# Patient Record
Sex: Male | Born: 2000 | Race: White | Hispanic: No | Marital: Single | Smoking: Current every day smoker
Health system: Southern US, Community
[De-identification: ages and names within clinical notes are randomized; demographics above are authoritative.]

## PROBLEM LIST (undated history)

## (undated) DIAGNOSIS — F329 Major depressive disorder, single episode, unspecified: Secondary | ICD-10-CM

## (undated) DIAGNOSIS — F32A Depression, unspecified: Secondary | ICD-10-CM

---
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
## 2018-05-06 ENCOUNTER — Encounter (HOSPITAL_COMMUNITY): Payer: Self-pay | Admitting: Emergency Medicine

## 2018-05-06 ENCOUNTER — Other Ambulatory Visit: Payer: Self-pay

## 2018-05-06 ENCOUNTER — Emergency Department (HOSPITAL_COMMUNITY)
Admission: EM | Admit: 2018-05-06 | Discharge: 2018-05-07 | Disposition: A | Discharge status: Other Acute Inpatient Hospital | Payer: BLUE CROSS/BLUE SHIELD | Attending: Emergency Medicine | Admitting: Emergency Medicine

## 2018-05-06 DIAGNOSIS — F332 Major depressive disorder, recurrent severe without psychotic features: Secondary | ICD-10-CM | POA: Insufficient documentation

## 2018-05-06 DIAGNOSIS — F329 Major depressive disorder, single episode, unspecified: Secondary | ICD-10-CM | POA: Diagnosis present

## 2018-05-06 DIAGNOSIS — R45851 Suicidal ideations: Secondary | ICD-10-CM | POA: Diagnosis not present

## 2018-05-06 HISTORY — DX: Depression, unspecified: F32.A

## 2018-05-06 HISTORY — DX: Major depressive disorder, single episode, unspecified: F32.9

## 2018-05-06 LAB — CBC
HCT: 45.7 % (ref 39.0–52.0)
HEMOGLOBIN: 16.1 g/dL (ref 13.0–17.0)
MCH: 30.4 pg (ref 26.0–34.0)
MCHC: 35.2 g/dL (ref 30.0–36.0)
MCV: 86.4 fL (ref 78.0–100.0)
PLATELETS: 243 10*3/uL (ref 150–400)
RBC: 5.29 MIL/uL (ref 4.22–5.81)
RDW: 12.4 % (ref 11.5–15.5)
WBC: 11.4 10*3/uL — ABNORMAL HIGH (ref 4.0–10.5)

## 2018-05-06 LAB — COMPREHENSIVE METABOLIC PANEL
ALBUMIN: 4.5 g/dL (ref 3.5–5.0)
ALK PHOS: 59 U/L (ref 38–126)
ALT: 51 U/L (ref 17–63)
AST: 31 U/L (ref 15–41)
Anion gap: 8 (ref 5–15)
BILIRUBIN TOTAL: 1.1 mg/dL (ref 0.3–1.2)
BUN: 22 mg/dL — AB (ref 6–20)
CO2: 27 mmol/L (ref 22–32)
CREATININE: 1.16 mg/dL (ref 0.61–1.24)
Calcium: 9.5 mg/dL (ref 8.9–10.3)
Chloride: 108 mmol/L (ref 101–111)
GFR calc Af Amer: >60 mL/min (ref >60)
GFR calc non Af Amer: >60 mL/min (ref >60)
GLUCOSE: 102 mg/dL — AB (ref 65–99)
POTASSIUM: 3.9 mmol/L (ref 3.5–5.1)
Sodium: 143 mmol/L (ref 135–145)
TOTAL PROTEIN: 7.8 g/dL (ref 6.5–8.1)

## 2018-05-06 LAB — ACETAMINOPHEN LEVEL

## 2018-05-06 LAB — SALICYLATE LEVEL: Salicylate Lvl: <7 mg/dL (ref 2.8–30.0)

## 2018-05-06 LAB — ETHANOL: Alcohol, Ethyl (B): <10 mg/dL (ref <10)

## 2018-05-06 NOTE — ED Triage Notes (Signed)
Pt BIB police with SI without plan; hx of SI attempt. Recent worsening depression.

## 2018-05-06 NOTE — BH Assessment (Addendum)
Assessment Note  GERRALD BASU is an 17 y.o. male who presents to the ED voluntarily due to worsening depression and SI without a plan. Pt states he has been depressed for the past several weeks due to relationship issues, financial problems, and having SI. Pt states he attempted to speak with his parents about how he has been feeling and they got into an argument. Pt states his parents told him if he does not come into the ED they are going to IVC him so he agreed to come VOL. Pt states he told his parents he was thinking about killing himself but denies that he has a plan. Pt has attempted suicide in the past by hanging himself when he was 17 years old. Pt appears anxious during the assessment and continues rubbing his hands on his legs and thighs. Pt states he does not want to be in the ED because he does not have a plan to commit suicide but states he is willing to stay VOL if it is recommended.   Per Nira Conn, NP pt is recommended for continued observation for safety and stabilization due to hx of suicide attempts and current SI without a plan. EDP Melene Plan, DO and pt's nurse Leslie Andrea, RN have been advised.  Diagnosis: MDD, recurrent, severe w/o psychosis; GAD, severe   Past Medical History:  Past Medical History:  Diagnosis Date  . Depression     History reviewed. No pertinent surgical history.  Family History: No family history on file.  Social History:  has no tobacco, alcohol, and drug history on file.  Additional Social History:  Alcohol / Drug Use Pain Medications: See MAR Prescriptions: See MAR Over the Counter: See MAR History of alcohol / drug use?: No history of alcohol / drug abuse  CIWA: CIWA-Ar BP: (!) 136/54 Pulse Rate: (!) 119 COWS:    Allergies: No Known Allergies  Home Medications:  (Not in a hospital admission)  OB/GYN Status:  No LMP for male patient.  General Assessment Data Location of Assessment: [facility] TTS Assessment: In system Is this a Tele  or Face-to-Face Assessment?: Face-to-Face Is this an Initial Assessment or a Re-assessment for this encounter?: Initial Assessment Marital status: Single Is patient pregnant?: No Pregnancy Status: No Living Arrangements: Parent Can pt return to current living arrangement?: Yes Admission Status: Voluntary Is patient capable of signing voluntary admission?: Yes Referral Source: Self/Family/Friend Insurance type: BCBS     Crisis Care Plan Living Arrangements: Parent Name of Psychiatrist: none Name of Therapist: none  Education Status Is patient currently in school?: No Is the patient employed, unemployed or receiving disability?: Employed  Risk to self with the past 6 months Suicidal Ideation: Yes-Currently Present Has patient been a risk to self within the past 6 months prior to admission? : No Suicidal Intent: No Has patient had any suicidal intent within the past 6 months prior to admission? : No Is patient at risk for suicide?: Yes Suicidal Plan?: No Has patient had any suicidal plan within the past 6 months prior to admission? : No Access to Means: No What has been your use of drugs/alcohol within the last 12 months?: denies use, labs unavailable at present  Previous Attempts/Gestures: Yes How many times?: 1 Triggers for Past Attempts: Family contact, Spouse contact, Other personal contacts Intentional Self Injurious Behavior: Cutting Comment - Self Injurious Behavior: pt has hx of cutting  Family Suicide History: No Recent stressful life event(s): Conflict (Comment), Other (Comment), Financial Problems(relationship issues) Persecutory voices/beliefs?:  No Depression: Yes Depression Symptoms: Despondent, Insomnia, Isolating, Loss of interest in usual pleasures, Feeling worthless/self pity, Feeling angry/irritable, Fatigue Substance abuse history and/or treatment for substance abuse?: No Suicide prevention information given to non-admitted patients: Not applicable  Risk  to Others within the past 6 months Homicidal Ideation: No Does patient have any lifetime risk of violence toward others beyond the six months prior to admission? : No Thoughts of Harm to Others: No Current Homicidal Intent: No Current Homicidal Plan: No Access to Homicidal Means: No History of harm to others?: No Assessment of Violence: None Noted Does patient have access to weapons?: No Criminal Charges Pending?: No Does patient have a court date: No Is patient on probation?: No  Psychosis Hallucinations: None noted Delusions: None noted  Mental Status Report Appearance/Hygiene: In scrubs, Unremarkable Eye Contact: Good Motor Activity: Freedom of movement Speech: Logical/coherent Level of Consciousness: Alert Mood: Depressed, Anxious, Sad, Worthless, low self-esteem, Despair Affect: Anxious, Depressed, Flat, Sad Anxiety Level: Moderate Thought Processes: Relevant, Coherent Judgement: Impaired Orientation: Person, Time, Place, Situation, Appropriate for developmental age Obsessive Compulsive Thoughts/Behaviors: None  Cognitive Functioning Concentration: Normal Memory: Remote Intact, Recent Intact Is patient IDD: No Is patient DD?: No Insight: Fair Impulse Control: Fair Appetite: Good Have you had any weight changes? : No Change Sleep: Decreased Total Hours of Sleep: 3 Vegetative Symptoms: None  ADLScreening [facility] Assessment [facility]) Patient's cognitive ability adequate to safely complete daily activities?: Yes Patient able to express need for assistance with ADLs?: Yes Independently performs ADLs?: Yes (appropriate for developmental age)  Prior Inpatient Therapy Prior Inpatient Therapy: No  Prior Outpatient Therapy Prior Outpatient Therapy: Yes Prior Therapy Dates: 2014 Prior Therapy Facilty/Provider(s): pt does not recall Reason for Treatment: depression, suicide attempt  Does patient have an ACCT team?: No Does patient have Intensive In-House Services?  :  No Does patient have [facility] services? : No Does patient have P4CC services?: No  ADL Screening (condition at time of admission) Patient's cognitive ability adequate to safely complete daily activities?: Yes Is the patient deaf or have difficulty hearing?: No Does the patient have difficulty seeing, even when wearing glasses/contacts?: No Does the patient have difficulty concentrating, remembering, or making decisions?: No Patient able to express need for assistance with ADLs?: Yes Does the patient have difficulty dressing or bathing?: No Independently performs ADLs?: Yes (appropriate for developmental age) Does the patient have difficulty walking or climbing stairs?: No Weakness of Legs: None Weakness of Arms/Hands: None  Home Assistive Devices/Equipment Home Assistive Devices/Equipment: None    Abuse/Neglect Assessment (Assessment to be complete while patient is alone) Abuse/Neglect Assessment Can Be Completed: Yes Physical Abuse: Denies Verbal Abuse: Denies Sexual Abuse: Denies Exploitation of patient/patient's resources: Denies Self-Neglect: Denies     Merchant navy officerAdvance Directives (For Healthcare) Does Patient Have a Medical Advance Directive?: No Would patient like information on creating a medical advance directive?: No - Patient declined    Additional Information 1:1 In Past 12 Months?: No CIRT Risk: No Elopement Risk: No Does patient have medical clearance?: Yes     Disposition: Per Nira ConnJason Berry, NP pt is recommended for continued observation for safety and stabilization due to hx of suicide attempts and current SI without a plan. EDP Melene PlanFloyd, Dan, DO and pt's nurse Leslie Andreaashell, RN have been advised. Disposition Initial Assessment Completed for this Encounter: Yes Disposition of Patient: (overnight OBS pending AM psych assessment ) Patient refused recommended treatment: No  On Site Evaluation by:   Reviewed with Physician:    Servando SnareAquicha R  Cristela Felt 05/06/2018 9:14 PM

## 2018-05-06 NOTE — ED Provider Notes (Signed)
[facility]-EMERGENCY DEPT Provider Note   CSN: [id number] Arrival date & time: 05/06/18  1825     History   Chief Complaint Chief Complaint  Patient presents with  . Suicidal  . Depression    HPI Lee Warren is a 17 y.o. male.  [age] y oM with a chief complaint of suicidal ideation.  The patient has tried to commit suicide before.  He tried to hang himself as well as tried to cut himself.  This was in the remote past though.  He is not sure that anything has significantly changed in his life but the stress of people trying to tell him what to do has made him feel like he needs to kill himself.  No current plan.  Has a history of bipolar disorder and is not taking his medication for a long time because of the way it made him feel.  He denies homicidal ideation denies hallucinations.  The history is provided by the patient.  Illness  This is a recurrent problem. The current episode started 2 days ago. The problem occurs constantly. The problem has been gradually worsening. Pertinent negatives include no chest pain, no abdominal pain, no headaches and no shortness of breath. Nothing aggravates the symptoms. Nothing relieves the symptoms. He has tried nothing for the symptoms. The treatment provided no relief.    Past Medical History:  Diagnosis Date  . Depression     There are no active problems to display for this patient.   History reviewed. No pertinent surgical history.      Home Medications    Prior to Admission medications   Not on File    Family History No family history on file.  Social History Social History   Tobacco Use  . Smoking status: Not on file  Substance Use Topics  . Alcohol use: Not on file  . Drug use: Not on file     Allergies   Patient has no known allergies.   Review of Systems Review of Systems  Constitutional: Negative for chills and fever.  HENT: Negative for congestion and facial swelling.   Eyes:  Negative for discharge and visual disturbance.  Respiratory: Negative for shortness of breath.   Cardiovascular: Negative for chest pain and palpitations.  Gastrointestinal: Negative for abdominal pain, diarrhea and vomiting.  Musculoskeletal: Negative for arthralgias and myalgias.  Skin: Negative for color change and rash.  Neurological: Negative for tremors, syncope and headaches.  Psychiatric/Behavioral: Positive for suicidal ideas. Negative for confusion and dysphoric mood.     Physical Exam Updated Vital Signs BP (!) 136/54 (BP Location: Left Arm)   Pulse (!) 119   Temp 99.4 F (37.4 C) (Oral)   Resp (!) 24   SpO2 98%   Physical Exam  Constitutional: He is oriented to person, place, and time. He appears well-developed and well-nourished.  Warm to touch  HENT:  Head: Normocephalic and atraumatic.  Eyes: Pupils are equal, round, and reactive to light. EOM are normal.  Neck: Normal range of motion. Neck supple. No JVD present.  Cardiovascular: Normal rate and regular rhythm. Exam reveals no gallop and no friction rub.  No murmur heard. Pulmonary/Chest: No respiratory distress. He has no wheezes.  Abdominal: He exhibits no distension. There is no tenderness. There is no rebound and no guarding.  Musculoskeletal: Normal range of motion.  Neurological: He is alert and oriented to person, place, and time.  Skin: No rash noted. He is diaphoretic. No pallor.  Psychiatric:  He has a normal mood and affect. His behavior is normal.  Nursing note and vitals reviewed.    ED Treatments / Results  Labs (all labs ordered are listed, but only abnormal results are displayed) Labs Reviewed  COMPREHENSIVE METABOLIC PANEL - Abnormal; Notable for the following components:      Result Value   Glucose, Bld 102 (*)    BUN 22 (*)    All other components within normal limits  ACETAMINOPHEN LEVEL - Abnormal; Notable for the following components:   Acetaminophen (Tylenol), Serum <10 (*)     All other components within normal limits  CBC - Abnormal; Notable for the following components:   WBC 11.4 (*)    All other components within normal limits  ETHANOL  SALICYLATE LEVEL  RAPID URINE DRUG SCREEN, HOSP PERFORMED    EKG None  Radiology No results found.  Procedures Procedures (including critical care time)  Medications Ordered in ED Medications - No data to display   Initial Impression / Assessment and Plan / ED Course  I have reviewed the triage vital signs and the nursing notes.  Pertinent labs & imaging results that were available during my care of the patient were reviewed by me and considered in my medical decision making (see chart for details).     17 yo M with a past medical history of bipolar disorder comes with a chief complaint of suicidal ideation.  The patient denies any medical symptoms.  He has diaphoretic and warm to touch on arrival.  He denies fevers cough congestion abdominal pain vomiting or diarrhea.  He did have a sweatshirt on and was brought in the back of the police car which he did not feel was adequately air-conditioned.  This is most likely the cause of his mild elevation of his temperature.  We will give him something to eat and drink.  Feel he is medically clear for evaluation.  Psych will reeval in am.   The patients results and plan were reviewed and discussed.   Any x-rays performed were independently reviewed by myself.   Differential diagnosis were considered with the presenting HPI.  Medications - No data to display  Vitals:   05/06/18 1848  BP: (!) 136/54  Pulse: (!) 119  Resp: (!) 24  Temp: 99.4 F (37.4 C)  TempSrc: Oral  SpO2: 98%    Final diagnoses:  Suicidal thoughts     Final Clinical Impressions(s) / ED Diagnoses   Final diagnoses:  Suicidal thoughts    ED Discharge Orders    None       Melene Plan, DO 05/06/18 2253

## 2018-05-06 NOTE — ED Notes (Signed)
Bed: [id number] Expected date:  Expected time:  Means of arrival:  Comments: Hold RM 30

## 2018-05-06 NOTE — Progress Notes (Signed)
Per Lee ConnJason Berry, NP pt is recommended for continued observation for safety and stabilization due to hx of suicide attempts and current SI without a Warren. EDP Lee PlanFloyd, Dan, DO and pt's nurse Lee Andreaashell, RN have been advised.  Lee BruinsAquicha Jaelen Warren, MSW, LCSW Therapeutic Triage Specialist  [phone]

## 2018-05-07 ENCOUNTER — Encounter (HOSPITAL_COMMUNITY): Payer: Self-pay

## 2018-05-07 ENCOUNTER — Inpatient Hospital Stay (HOSPITAL_COMMUNITY)
Admission: AD | Admit: 2018-05-07 | Discharge: 2018-05-11 | DRG: 885 | Disposition: A | Discharge status: Home / Self Care | Payer: BLUE CROSS/BLUE SHIELD | Source: Intra-hospital | Attending: Psychiatry | Admitting: Psychiatry

## 2018-05-07 ENCOUNTER — Other Ambulatory Visit: Payer: Self-pay

## 2018-05-07 DIAGNOSIS — F17210 Nicotine dependence, cigarettes, uncomplicated: Secondary | ICD-10-CM | POA: Diagnosis present

## 2018-05-07 DIAGNOSIS — Z915 Personal history of self-harm: Secondary | ICD-10-CM

## 2018-05-07 DIAGNOSIS — R454 Irritability and anger: Secondary | ICD-10-CM | POA: Diagnosis not present

## 2018-05-07 DIAGNOSIS — F332 Major depressive disorder, recurrent severe without psychotic features: Secondary | ICD-10-CM | POA: Diagnosis present

## 2018-05-07 DIAGNOSIS — F419 Anxiety disorder, unspecified: Secondary | ICD-10-CM | POA: Diagnosis not present

## 2018-05-07 DIAGNOSIS — R45851 Suicidal ideations: Secondary | ICD-10-CM | POA: Diagnosis present

## 2018-05-07 DIAGNOSIS — Z599 Problem related to housing and economic circumstances, unspecified: Secondary | ICD-10-CM | POA: Diagnosis not present

## 2018-05-07 DIAGNOSIS — G4700 Insomnia, unspecified: Secondary | ICD-10-CM | POA: Diagnosis not present

## 2018-05-07 DIAGNOSIS — F639 Impulse disorder, unspecified: Secondary | ICD-10-CM | POA: Diagnosis present

## 2018-05-07 DIAGNOSIS — Z630 Problems in relationship with spouse or partner: Secondary | ICD-10-CM | POA: Diagnosis not present

## 2018-05-07 DIAGNOSIS — R451 Restlessness and agitation: Secondary | ICD-10-CM | POA: Diagnosis not present

## 2018-05-07 DIAGNOSIS — F6381 Intermittent explosive disorder: Secondary | ICD-10-CM

## 2018-05-07 LAB — RAPID URINE DRUG SCREEN, HOSP PERFORMED
Amphetamines: NOT DETECTED
BARBITURATES: NOT DETECTED
BENZODIAZEPINES: NOT DETECTED
Cocaine: NOT DETECTED
Opiates: NOT DETECTED
TETRAHYDROCANNABINOL: NOT DETECTED

## 2018-05-07 MED ORDER — ACETAMINOPHEN 325 MG PO TABS: 650.0000 mg | ORAL_TABLET | Freq: Four times a day (QID) | ORAL | Status: DC | PRN

## 2018-05-07 MED ORDER — ALUM & MAG HYDROXIDE-SIMETH 200-200-20 MG/5ML PO SUSP: 30.0000 mL | ORAL | Status: DC | PRN

## 2018-05-07 MED ORDER — TRAZODONE HCL 50 MG PO TABS
50.0000 mg | ORAL_TABLET | Freq: Every evening | ORAL | Status: DC | PRN
  Administered 2018-05-08 – 2018-05-10 (×3): 50 mg via ORAL
  Filled 2018-05-07 (×12): qty 1

## 2018-05-07 MED ORDER — NICOTINE 21 MG/24HR TD PT24
21.0000 mg | MEDICATED_PATCH | Freq: Every day | TRANSDERMAL | Status: DC
  Administered 2018-05-08 – 2018-05-11 (×4): 21 mg via TRANSDERMAL
  Filled 2018-05-07 (×7): qty 1

## 2018-05-07 MED ORDER — HYDROXYZINE HCL 25 MG PO TABS
25.0000 mg | ORAL_TABLET | Freq: Four times a day (QID) | ORAL | Status: DC | PRN
  Administered 2018-05-08: 25 mg via ORAL
  Filled 2018-05-07: qty 1

## 2018-05-07 MED ORDER — MAGNESIUM HYDROXIDE 400 MG/5ML PO SUSP: 30.0000 mL | Freq: Every day | ORAL | Status: DC | PRN

## 2018-05-07 NOTE — BH Assessment (Signed)
Patient evaluated by Dr. Sharma Lee Warren and Wyvonna Lee Issaic Welliver, NP. Inpatient Treatment was recommended. [facility] Bed assignment pending.

## 2018-05-07 NOTE — ED Notes (Signed)
Patient's mother called to say if patient is discharged he will not come home and is homeless without a driver's permit.  Mother reports patient is [facility] by Franchot [facility] and has been diagnosed with bipolar disorder but is not taking any medications.  Patient has become increasingly suicidal in the past few months even making up songs about suicide.

## 2018-05-07 NOTE — Progress Notes (Signed)
Skin and belongings assessment complete. Skin clear. No bruises, scratches, tattoos, or wounds. Consent papers reviewed with patient. All questions answered. Patient signed 72 hour request for discharge paper 05/07/18 at 1908.  Patient oriented to the unit. Safety maintained with 15 minute checks.

## 2018-05-07 NOTE — Progress Notes (Signed)
Pt is a 17 year old male admitted with depression and SI without a plan    He denies intent presently and said he just wants to go home   He said next time he feels like that he will talk to his parents or his girlfriend   He said his parents made him come to the ED and he doesn't feel like he should be here   Pt denies previous attempts but in his history it said he tried to hang himself when he was 12     Pt was admitted and orders received    Verbal support given   Medications offered but patient said he did not need anything to help him sleep   Pt did sign his 72 hour request for discharge   Q 15 min checks started   Pt is safe at present

## 2018-05-07 NOTE — ED Notes (Signed)
[facility]  [facility] called for transport.

## 2018-05-07 NOTE — ED Notes (Addendum)
Mother visiting with patient.  Patient upset that he is going to be admitted.  " I won't go, they can't make me."  Explained to patient and mother that if patient does not agree to go voluntarily, he would be IVC'd.  Patient replied tearful,"  I just want to go home and play my music."  I came in voluntary, why do I have to be admitted to the hospital?"  Nurse gently reminded patient that he had been having suicidal thoughts and needed to be placed on some medication that would help him with that.  Patient denied suicidal thoughts, but admits he needs medication.

## 2018-05-07 NOTE — ED Notes (Signed)
Patient lying in bed.  Mother still visiting at bedside.

## 2018-05-07 NOTE — ED Notes (Signed)
Patient has been calm and cooperative during shift. Patient has interactive appropriately with staff and peers.  Patient has asked several times if he can go home. Patient denies SI/HI?AVH at this time. Writer explained treatment plan to patient. Encouragement and support provided and safety maintain. Q 15 min safety checks remain in place and video monitoring.

## 2018-05-07 NOTE — Tx Team (Signed)
Initial Treatment Plan 05/07/2018 7:42 PM Lee Warren Tilley [id number]    PATIENT STRESSORS: Marital or family conflict Medication change or noncompliance   PATIENT STRENGTHS: Average or above average intelligence General fund of knowledge Supportive family/friends   PATIENT IDENTIFIED PROBLEMS: "I just want to leave   I dont need to be here"  "get over my depression"                   DISCHARGE CRITERIA:  Improved stabilization in mood, thinking, and/or behavior Reduction of life-threatening or endangering symptoms to within safe limits Verbal commitment to aftercare and medication compliance  PRELIMINARY DISCHARGE PLAN: Attend aftercare/continuing care group Outpatient therapy  PATIENT/FAMILY INVOLVEMENT: This treatment plan has been presented to and reviewed with the patient, Lee Warren Mcgahan, and/or family member, .  The patient and family have been given the opportunity to ask questions and make suggestions.  Andrena Mewsuttall, Gunnison Chahal J, RN 05/07/2018, 7:42 PM

## 2018-05-07 NOTE — Consult Note (Addendum)
[facility] Consult   Reason for Consult:  Thoughts of suicidal ideation Referring Physician:  ER physiciam Patient Identification: Lee Warren MRN:  [id number] Principal Diagnosis: MDD (major depressive disorder), recurrent severe, without psychosis ([place]) Diagnosis:   Patient Active Problem List   Diagnosis Date Noted  . MDD (major depressive disorder), recurrent severe, without psychosis ([place]) [F33.2] 05/07/2018    Total Time spent with patient: 30 minutes  Subjective:   Lee Warren is a 17 y.o. male patient admitted with thoughts of suicide for the past month in the setting of relationship stressors and worries about his future. Previously seen psychiatrist but cannot remember the name.  Tried Wellbutrin and Vyvanse before.  Patient does reference some mania behaviors a few months ago, beginning in January to March when he felt really good although this appears most likely a period when he was no longer depressed and feeling better. He reports no problems with appetite but poor sleep.   Past Psychiatric History: Major depression and prior history of self-injurious behaviors by burning at 17 y/o.   Risk to Self: Suicidal Ideation: Yes-Currently Present Suicidal Intent: No Is patient at risk for suicide?: Yes Suicidal Plan?: No Access to Means: No What has been your use of drugs/alcohol within the last 12 months?: denies use, labs unavailable at present  How many times?: 1 Triggers for Past Attempts: Family contact, Spouse contact, Other personal contacts Intentional Self Injurious Behavior: Cutting Comment - Self Injurious Behavior: pt has hx of cutting  Risk to Others: Homicidal Ideation: No Thoughts of Harm to Others: No Current Homicidal Intent: No Current Homicidal Plan: No Access to Homicidal Means: No History of harm to others?: No Assessment of Violence: None Noted Does patient have access to weapons?: No Criminal Charges Pending?: No Does patient  have a court date: No Prior Inpatient Therapy: Prior Inpatient Therapy: No Prior Outpatient Therapy: Prior Outpatient Therapy: Yes Prior Therapy Dates: Feb 2019 Prior Therapy Facilty/Provider(s): pt does not recall Reason for Treatment: depression, suicide attempt  Does patient have an ACCT team?: No Does patient have Intensive In-House Services?  : No Does patient have [facility] services? : No Does patient have P4CC services?: No  Past Medical History:  Past Medical History:  Diagnosis Date  . Depression    History reviewed. No pertinent surgical history. Family History: No family history on file. Family Psychiatric  History: Unknown  Social History:  Social History   Substance and Sexual Activity  Alcohol Use Not on file     Social History   Substance and Sexual Activity  Drug Use Not on file    Social History   Socioeconomic History  . Marital status: Single    Spouse name: Not on file  . Number of children: Not on file  . Years of education: Not on file  . Highest education level: Not on file  Occupational History  . Not on file  Social Needs  . Financial resource strain: Not on file  . Food insecurity:    Worry: Not on file    Inability: Not on file  . Transportation needs:    Medical: Not on file    Non-medical: Not on file  Tobacco Use  . Smoking status: Not on file  Substance and Sexual Activity  . Alcohol use: Not on file  . Drug use: Not on file  . Sexual activity: Not on file  Lifestyle  . Physical activity:    Days per week: Not on file  Minutes per session: Not on file  . Stress: Not on file  Relationships  . Social connections:    Talks on phone: Not on file    Gets together: Not on file    Attends religious service: Not on file    Active member of club or organization: Not on file    Attends meetings of clubs or organizations: Not on file    Relationship status: Not on file  Other Topics Concern  . Not on file  Social History  Narrative  . Not on file   Additional Social History:    Allergies:  No Known Allergies  Labs:  Results for orders placed or performed during the hospital encounter of 05/06/18 (from the past 48 hour(s))  Comprehensive metabolic panel     Status: Abnormal   Collection Time: 05/06/18  9:05 PM  Result Value Ref Range   Sodium 143 135 - 145 mmol/L   Potassium 3.9 3.5 - 5.1 mmol/L   Chloride 108 101 - 111 mmol/L   CO2 27 22 - 32 mmol/L   Glucose, Bld 102 (H) 65 - 99 mg/dL   BUN 22 (H) 6 - 20 mg/dL   Creatinine, Ser 1.16 0.61 - 1.24 mg/dL   Calcium 9.5 8.9 - 10.3 mg/dL   Total Protein 7.8 6.5 - 8.1 g/dL   Albumin 4.5 3.5 - 5.0 g/dL   AST 31 15 - 41 U/L   ALT 51 17 - 63 U/L   Alkaline Phosphatase 59 38 - 126 U/L   Total Bilirubin 1.1 0.3 - 1.2 mg/dL   GFR calc non Af Amer >60 >60 mL/min   GFR calc Af Amer >60 >60 mL/min    Comment: (NOTE) The eGFR has been calculated using the CKD EPI equation. This calculation has not been validated in all clinical situations. eGFR's persistently <60 mL/min signify possible Chronic Kidney Disease.    Anion gap 8 5 - 15    Comment: Performed at [facility], [place] [address], [place], [place] [address]  Ethanol     Status: None   Collection Time: 05/06/18  9:05 PM  Result Value Ref Range   Alcohol, Ethyl (B) <10 <10 mg/dL    Comment: (NOTE) Lowest detectable limit for serum alcohol is 10 mg/dL. For medical purposes only. Performed at [facility], [place] [address], [place], [place] [address]   Salicylate level     Status: None   Collection Time: 05/06/18  9:05 PM  Result Value Ref Range   Salicylate Lvl <7.8 2.8 - 30.0 mg/dL    Comment: Performed at [facility], [place] [address], [place], [place] [address]  Acetaminophen level     Status: Abnormal   Collection Time: 05/06/18  9:05 PM  Result Value Ref Range   Acetaminophen (Tylenol), Serum <10 (L) 10 - 30 ug/mL    Comment:  (NOTE) Therapeutic concentrations vary significantly. A range of 10-30 ug/mL  may be an effective concentration for many patients. However, some  are best treated at concentrations outside of this range. Acetaminophen concentrations >150 ug/mL at 4 hours after ingestion  and >50 ug/mL at 12 hours after ingestion are often associated with  toxic reactions. Performed at [facility], [place] [address], [place], [place] [address]   cbc     Status: Abnormal   Collection Time: 05/06/18  9:05 PM  Result Value Ref Range   WBC 11.4 (H) 4.0 - 10.5 K/uL   RBC 5.29  4.22 - 5.81 MIL/uL   Hemoglobin 16.1 13.0 - 17.0 g/dL   HCT 45.7 39.0 - 52.0 %   MCV 86.4 78.0 - 100.0 fL   MCH 30.4 26.0 - 34.0 pg   MCHC 35.2 30.0 - 36.0 g/dL   RDW 12.4 11.5 - 15.5 %   Platelets 243 150 - 400 K/uL    Comment: Performed at [facility], [place] [address], [place], [place] [address]  Rapid urine drug screen (hospital performed)     Status: None   Collection Time: 05/07/18  6:10 AM  Result Value Ref Range   Opiates NONE DETECTED NONE DETECTED   Cocaine NONE DETECTED NONE DETECTED   Benzodiazepines NONE DETECTED NONE DETECTED   Amphetamines NONE DETECTED NONE DETECTED   Tetrahydrocannabinol NONE DETECTED NONE DETECTED   Barbiturates NONE DETECTED NONE DETECTED    Comment: (NOTE) DRUG SCREEN FOR MEDICAL PURPOSES ONLY.  IF CONFIRMATION IS NEEDED FOR ANY PURPOSE, NOTIFY LAB WITHIN 5 DAYS. LOWEST DETECTABLE LIMITS FOR URINE DRUG SCREEN Drug Class                     Cutoff (ng/mL) Amphetamine and metabolites    1000 Barbiturate and metabolites    200 Benzodiazepine                 176 Tricyclics and metabolites     300 Opiates and metabolites        300 Cocaine and metabolites        300 THC                            50 Performed at [facility], [place] [address] [place], [place] [address]     No current facility-administered medications for this  encounter.    No current outpatient medications on file.    Musculoskeletal: Strength & Muscle Tone: within normal limits Gait & Station: normal Patient leans: N/A  Psychiatric Specialty Exam:see NP SRA Physical Exam  Nursing note and vitals reviewed. Constitutional: He is oriented to person, place, and time. He appears well-developed and well-nourished.  HENT:  Head: Normocephalic and atraumatic.  Neck: Normal range of motion.  Respiratory: Effort normal.  Musculoskeletal: Normal range of motion.  Neurological: He is alert and oriented to person, place, and time.  Skin: No rash noted.  Psychiatric: His speech is normal and behavior is normal. Judgment and thought content normal. Cognition and memory are normal. He exhibits a depressed mood.    Review of Systems  Psychiatric/Behavioral: Positive for depression and suicidal ideas. Negative for hallucinations and substance abuse.  All other systems reviewed and are negative.   Blood pressure 128/71, pulse 84, temperature 98 F (36.7 C), temperature source Oral, resp. rate 16, SpO2 99 %.There is no height or weight on file to calculate BMI.   General Appearance: Fairly Groomed  Engineer, water::  Good  Speech:  Clear and Coherent and Normal Rate  Volume:  Normal  Mood:  Depressed  Affect:  Congruent  Thought Process:  Linear and Descriptions of Associations: Intact  Orientation:  Full (Time, Place, and Person)  Thought Content:  WDL  Suicidal Thoughts:  Yes without intent/plan.   Homicidal Thoughts:  No  Memory:  Immediate;   Fair Recent;   Fair  Judgement:  Fair  Insight:  Fair  Psychomotor Activity:  Normal  Concentration:  Fair  Recall:  [employer] of Knowledge:Fair  Language: Fair  Akathisia:  No  Handed:  Right  AIMS (if indicated):   N/A  Assets:  Communication Skills Desire for Improvement Physical Health Social Support  Sleep:   Poor  Cognition: WNL  ADL's:  Intact     Treatment Plan Summary: Daily  contact with patient to assess and evaluate symptoms and progress in treatment, Medication management and Plan Admit inpatient [address]  Disposition: Recommend psychiatric Inpatient admission when medically cleared.  Nanci Pina, FNP 05/07/2018 1:12 PM    Patient seen face-to-face for psychiatric evaluation, chart reviewed and case discussed with the physician extender and developed treatment plan. Reviewed the information documented and agree with the treatment plan.  Buford Dresser, DO 05/07/18 7:22 PM

## 2018-05-08 ENCOUNTER — Encounter (HOSPITAL_COMMUNITY): Payer: Self-pay | Admitting: *Deleted

## 2018-05-08 DIAGNOSIS — F419 Anxiety disorder, unspecified: Secondary | ICD-10-CM

## 2018-05-08 DIAGNOSIS — Z630 Problems in relationship with spouse or partner: Secondary | ICD-10-CM

## 2018-05-08 DIAGNOSIS — F332 Major depressive disorder, recurrent severe without psychotic features: Principal | ICD-10-CM

## 2018-05-08 DIAGNOSIS — Z599 Problem related to housing and economic circumstances, unspecified: Secondary | ICD-10-CM

## 2018-05-08 DIAGNOSIS — F6381 Intermittent explosive disorder: Secondary | ICD-10-CM

## 2018-05-08 DIAGNOSIS — R45851 Suicidal ideations: Secondary | ICD-10-CM

## 2018-05-08 DIAGNOSIS — F639 Impulse disorder, unspecified: Secondary | ICD-10-CM

## 2018-05-08 DIAGNOSIS — R451 Restlessness and agitation: Secondary | ICD-10-CM

## 2018-05-08 DIAGNOSIS — Z915 Personal history of self-harm: Secondary | ICD-10-CM

## 2018-05-08 DIAGNOSIS — F17210 Nicotine dependence, cigarettes, uncomplicated: Secondary | ICD-10-CM

## 2018-05-08 DIAGNOSIS — R454 Irritability and anger: Secondary | ICD-10-CM

## 2018-05-08 LAB — LIPID PANEL
Cholesterol: 208 mg/dL — ABNORMAL HIGH (ref 0–200)
HDL: 30 mg/dL — AB (ref >40)
LDL Cholesterol: 155 mg/dL — ABNORMAL HIGH (ref 0–99)
Total CHOL/HDL Ratio: 6.9 RATIO
Triglycerides: 117 mg/dL (ref <150)
VLDL: 23 mg/dL (ref 0–40)

## 2018-05-08 LAB — TSH: TSH: 2.333 u[IU]/mL (ref 0.350–4.500)

## 2018-05-08 LAB — HEMOGLOBIN A1C
HEMOGLOBIN A1C: 4.9 % (ref 4.8–5.6)
MEAN PLASMA GLUCOSE: 93.93 mg/dL

## 2018-05-08 MED ORDER — ARIPIPRAZOLE 2 MG PO TABS
2.0000 mg | ORAL_TABLET | Freq: Every day | ORAL | Status: DC
  Administered 2018-05-08 – 2018-05-09 (×2): 2 mg via ORAL
  Filled 2018-05-08 (×5): qty 1

## 2018-05-08 MED ORDER — BUPROPION HCL ER (XL) 150 MG PO TB24
150.0000 mg | ORAL_TABLET | Freq: Every day | ORAL | Status: DC
  Administered 2018-05-08 – 2018-05-11 (×4): 150 mg via ORAL
  Filled 2018-05-08 (×8): qty 1

## 2018-05-08 NOTE — Progress Notes (Signed)
Patient did not attend afternoon activity/psychoeducational group despite encouragement. 

## 2018-05-08 NOTE — BHH Group Notes (Signed)
Adult Psychoeducational Group Note  Date:  05/08/2018 Time:  9:07 PM  Group Topic/Focus:  Wrap-Up Group:   The focus of this group is to help patients review their daily goal of treatment and discuss progress on daily workbooks.  Participation Level:  Minimal  Participation Quality:  Appropriate and Attentive  Affect:  Flat        Cognitive:  Alert and Appropriate  Insight: Appropriate and Good  Engagement in Group:  Engaged  Modes of Intervention:  Discussion and Education  Additional Comments:  Pt attended and participated in wrap up group this evening. Pt had a good day, due to them getting sleep, which was also the pt goal.    Chrisandra NettersOctavia A Shanyn Preisler 05/08/2018, 9:07 PM

## 2018-05-08 NOTE — BHH Group Notes (Signed)
BHH Group Notes: (Clinical Social Work)   05/08/2018      Type of Therapy:  Group Therapy   Participation Level:  Did Not Attend despite MHT prompting   Shellia CleverlyStephanie N Jaycen Vercher, LCSW  05/08/2018 12:04 PM

## 2018-05-08 NOTE — Progress Notes (Signed)
Adult Psychoeducational Group Note  Date:  05/08/2018 Time:  6:43 PM  Group Topic/Focus:  Goals Group:   The focus of this group is to help patients establish daily goals to achieve during treatment and discuss how the patient can incorporate goal setting into their daily lives to aide in recovery.  Participation Level:  Did Not Attend  Participation Quality:  Resistant  Affect:  Flat  Cognitive:  Lacking  Insight: Lacking  Engagement in Group:  Limited  Modes of Intervention:  Limit-setting  Additional Comments:  Pt did not attend group  Graceann CongressWollie, Tarique Loveall Celcia 05/08/2018, 6:43 PM

## 2018-05-08 NOTE — BHH Counselor (Signed)
Adult Comprehensive Assessment  Patient ID: Lee Warren, male   DOB: 12/23/2000, 17 y.o.   MRN: [id number]  Information Source: Information source: Patient  Current Stressors:  Patient states their primary concerns and needs for treatment are:: Suicidal thoughts Patient states their goals for this hospitilization and ongoing recovery are:: detox Educational / Learning stressors: Hard to [employer] Employment / Job issues: Denies Family Relationships: relationship with father is stressful Surveyor, quantity / Lack of resources (include bankruptcy): Denies Housing / Lack of housing: Denies Physical health (include injuries & life threatening diseases): Denies Social relationships: Denies Substance abuse: Denies Bereavement / Loss: Death of two best friends last year  Living/Environment/Situation:  Living Arrangements: Parent Living conditions (as described by patient or guardian): comfortable Who else lives in the home?: Mom and dad  How long has patient lived in current situation?: 12 years What is atmosphere in current home: Comfortable  Family History:  Marital status: Single Are you sexually active?: Yes What is your sexual orientation?: Straight Has your sexual activity been affected by drugs, alcohol, medication, or emotional stress?: N/A Does patient have children?: No  Childhood History:  By whom was/is the patient raised?: Both parents Description of patient's relationship with caregiver when they were a child: Mom - she was protective. Dad - even more protective Patient's description of current relationship with people who raised him/her: Mom - distant. DAd - stressful How were you disciplined when you got in trouble as a child/adolescent?: Physical discipline when bad. Normal stuff time out.  Does patient have siblings?: Yes Number of Siblings: 1 Description of patient's current relationship with siblings: Brother - we see and talk but not often Did patient suffer any  verbal/emotional/physical/sexual abuse as a child?: No Did patient suffer from severe childhood neglect?: No Has patient ever been sexually abused/assaulted/raped as an adolescent or adult?: No Was the patient ever a victim of a crime or a disaster?: No Witnessed domestic violence?: No Has patient been effected by domestic violence as an adult?: No  Education:  Highest grade of school patient has completed: Therapist, sports Currently a student?: No Learning disability?: No  Employment/Work Situation:   Employment situation: Unemployed What is the longest time patient has a held a job?: 6 months 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 [employer] Did You Receive Any Psychiatric Treatment/Services While in Equities trader?: No Are There Guns or Other Weapons in Your Home?: No  Financial Resources:   Surveyor, quantity resources: No income Does patient have a Lawyer or guardian?: No  Alcohol/Substance Abuse:   What has been your use of drugs/alcohol within the last 12 months?: None  Social Support System:   [employer] Support System: Production assistant, radio System: Family and friends Type of faith/religion: Christianity How does patient's faith help to cope with current illness?: Seeking guidance  Leisure/Recreation:   Leisure and Hobbies: Music  Strengths/Needs:   What is the patient's perception of their strengths?: Music, talking to people Patient states they can use these personal strengths during their treatment to contribute to their recovery: Idk Patient states these barriers may affect/interfere with their treatment: anxiety Patient states these barriers may affect their return to the community: anxiety  Discharge Plan:   Currently receiving community mental health services: Yes (From Whom)(Kim Daise - medication and therapist at her office.) Patient states concerns and preferences for aftercare planning are: Continue with current  providers Patient states they will know when they are safe and ready for discharge when:  I think I am safe and ready now.  Does patient have access to transportation?: Yes Does patient have financial barriers related to discharge medications?: Yes(Insurance with parents.) Will patient be returning to same living situation after discharge?: Yes  Summary/Recommendations:   Summary and Recommendations (to be completed by the evaluator): Patient is a 17 year old male a47dmitted with worsening depression and suicidal ideation without a plan. Patient has attempted suicide previously at age 17. Patient has a history of depression and anxiety. Primary stressors include negative thinking patterns, stressful relationship with father and the death of his two best friend's last year. Patient denies SA. Patient reports currently receiving mental health services and wanting to continue with his current provider. Patient will benefit from crisis stabilization, medication evaluation, group therapy and psychoeducation, in addition to case management for discharge planning. At discharge it is recommended that Patient adhere to the established discharge plan and continue in treatment.  Shellia CleverlyStephanie N Naomie Crow. 05/08/2018

## 2018-05-08 NOTE — Progress Notes (Signed)
NSG 7a-7p shift:   Pt states that he is depressed because his girlfriend is in rehab.  "She's been on the child unit, and you've probably worked with her".  Pt denies any substance abuse problems himself and states that he wants to get himself together so he can be there to support her.  Pt is very depressed and is guarded.     A: Support, education, and encouragement provided as needed.  Level 3 checks continued for safety.  HIPPA maintained.  R: Pt. minimally receptive to intervention/s.  Safety maintained.  Joaquin MusicMary Clyde Zarrella, RN

## 2018-05-08 NOTE — BHH Suicide Risk Assessment (Signed)
[facility] Admission Suicide Risk Assessment   Nursing information obtained from:  Patient Demographic factors:  Male, Adolescent or young adult Current Mental Status:  Self-harm thoughts Loss Factors:  NA Historical Factors:  Prior suicide attempts Risk Reduction Factors:  Positive coping skills or problem solving skills, Living with another person, especially a relative, Sense of responsibility to family, Positive social support  Total Time spent with patient: 45 minutes Principal Problem: <principal problem not specified> Diagnosis:   Patient Active Problem List   Diagnosis Date Noted  . MDD (major depressive disorder), recurrent severe, without psychosis ([facility]) [F33.2] 05/07/2018   Subjective Data: Patient is seen and examined.  Patient is a 17 year old male with a reported past psychiatric history significant for depression, attention deficit disorder, and recent escalation and anger issues who presented to the [facility] on 05/06/2018 for evaluation.  The patient admitted that he had had worsening depression over the last several weeks due to relationship issues, financial problems, and having fleeting suicidal ideation.  The patient has been seeing a psychiatrist for several years, and last saw them on March of this year.  The psychiatrist recommended starting Wellbutrin as well as Abilify.  The patient felt at that time that he was "turning the corner" and that his mood was improving.  He stated that things worsened over the last month because of problems with relationship.  He stated that "she is much worse off than I am".  He stated that he got very angry when she would not listen to some of the things that he was recommending and in their conversations.  Yesterday his parents gave him the option of coming to the emergency room voluntarily, or they were going to place him under involuntary commitment.  Patient denied any suicidal ideation and that he did not have a plan.  The  patient had attempted suicide in the past by hanging hanging himself when he was approximately 17 years of age.  He was admitted to the hospital for evaluation and stabilization.  He stated he had taken multiple medications in the past, but had not started the Abilify or the Wellbutrin.  He stated the Wellbutrin had been effective in the past.  Continued Clinical Symptoms:  Alcohol Use Disorder Identification Test Final Score (AUDIT): 0 The "Alcohol Use Disorders Identification Test", Guidelines for Use in Primary Care, Second Edition.  World Science writer [facility]). Score between 0-7:  no or low risk or alcohol related problems. Score between 8-15:  moderate risk of alcohol related problems. Score between 16-19:  high risk of alcohol related problems. Score 20 or above:  warrants further diagnostic evaluation for alcohol dependence and treatment.   CLINICAL FACTORS:   Depression:   Anhedonia Hopelessness Impulsivity Insomnia More than one psychiatric diagnosis   Musculoskeletal: Strength & Muscle Tone: within normal limits Gait & Station: normal Patient leans: N/A  Psychiatric Specialty Exam: Physical Exam  Nursing note and vitals reviewed. Constitutional: He is oriented to person, place, and time. He appears well-developed and well-nourished.  HENT:  Head: Normocephalic and atraumatic.  Respiratory: Effort normal.  Musculoskeletal: Normal range of motion.  Neurological: He is oriented to person, place, and time.    ROS  Blood pressure 134/74, pulse (!) 111, temperature 98 F (36.7 C), temperature source Oral, resp. rate 20.There is no height or weight on file to calculate BMI.  General Appearance: Disheveled  Eye Contact:  Minimal  Speech:  Normal Rate  Volume:  Decreased  Mood:  Anxious  Affect:  Congruent  Thought Process:  Coherent  Orientation:  Full (Time, Place, and Person)  Thought Content:  Logical  Suicidal Thoughts:  No  Homicidal Thoughts:  No  Memory:   Immediate;   Fair Recent;   Fair Remote;   Fair  Judgement:  Impaired  Insight:  Lacking  Psychomotor Activity:  Psychomotor Retardation  Concentration:  Concentration: Fair and Attention Span: Fair  Recall:  [employer]  Fund of Knowledge:  Fair  Language:  Good  Akathisia:  Negative  Handed:  Right  AIMS (if indicated):     Assets:  Desire for Improvement Financial Resources/Insurance Housing Physical Health Social Support  ADL's:  Intact  Cognition:  WNL  Sleep:  Number of Hours: 4.5      COGNITIVE FEATURES THAT CONTRIBUTE TO RISK:  None    SUICIDE RISK:   Minimal: No identifiable suicidal ideation.  Patients presenting with no risk factors but with morbid ruminations; may be classified as minimal risk based on the severity of the depressive symptoms  PLAN OF CARE: Patient is seen and examined.  Patient is a 17 year old male with a past psychiatric history significant for major depression, attention deficit disorder and recent increase in irritability.  It sounds like the irritability is very much linked to the relationship issues, but his outside psychiatrist recently recommended that he start Abilify and Wellbutrin XL.  He chose not to do this.  He did admit that he had been previously treated successfully with the Wellbutrin.  We will start Wellbutrin XL 150 mg p.o. daily.  We will also start the Abilify 2 mg p.o. daily today.  He will be integrated into the milieu.  He will be encouraged to attend groups.  He will be placed on 15-minute checks.  He will meet with social work both individually and in groups.  We will contact his family for collateral information.  He currently denies suicidality, but I am sure there will be additional information from his parents regarding his history.   I certify that inpatient services furnished can reasonably be expected to improve the patient's condition.   Antonieta PertGreg Lawson Elaya Droege, MD 05/08/2018, 9:16 AM

## 2018-05-08 NOTE — H&P (Signed)
Psychiatric Admission Assessment Adult  Patient Identification: Lee Warren MRN:  [id number] Date of Evaluation:  05/08/2018 Chief Complaint:  MDD REC SEV Principal Diagnosis: <principal problem not specified> Diagnosis:   Patient Active Problem List   Diagnosis Date Noted  . MDD (major depressive disorder), recurrent severe, without psychosis (HCC) [F33.2] 05/07/2018   History of Present Illness: Patient is seen and examined.  Patient is a 17 year old male with a reported past psychiatric history significant for depression, attention deficit disorder as well as anger issues which have recently escalated.  He presented to the [facility] emergency department on 05/06/2018 for evaluation.  The patient admitted that recently his depression had worsened over the last several weeks, and he had seen an increase in his irritability.  He stated that the irritability was secondary to problems in a relationship as well as financial problems.  He also admitted to having fleeting suicidal ideation.  He had been seeing a psychiatrist as an outpatient for several years.  He had last seen a psychiatrist in March of this year.  The psychiatrist recommended starting Wellbutrin as well as Abilify.  He stated that Wellbutrin had previously helped him.  He felt at that time that he was actually turning the corner and doing better, so he did not start these medications.  He did not follow-up with his psychiatrist.  He stated 1 of the biggest issues with the relationship is that "she has worse mental health issues than I do".  He lives with his parents, and apparently his depression, irritability and suicidal thoughts were quite apparent to his parents.  They threatened involuntary commitment.  He decided to come in voluntarily.  He has a past history of suicide attempt by hanging at age 62.  He was admitted to the hospital for evaluation and stabilization. Associated Signs/Symptoms: Depression Symptoms:   depressed mood, anhedonia, insomnia, psychomotor agitation, fatigue, feelings of worthlessness/guilt, difficulty concentrating, hopelessness, suicidal thoughts without plan, suicidal attempt, anxiety, loss of energy/fatigue, disturbed sleep, weight gain, (Hypo) Manic Symptoms:  Impulsivity, Anxiety Symptoms:  Excessive Worry, Psychotic Symptoms:  Denied PTSD Symptoms: Negative Total Time spent with patient: 45 minutes  Past Psychiatric History: Patient is had one previous psychiatric admission at age 70 after attempting to hang himself.  He has been seen by psychiatrist as an outpatient for several years.  He admitted that he had previously been treated with Vyvanse and other stimulants.  He is also been previously treated with Wellbutrin.  Is the patient at risk to self? Yes.    Has the patient been a risk to self in the past 6 months? Yes.    Has the patient been a risk to self within the distant past? No.  Is the patient a risk to others? No.  Has the patient been a risk to others in the past 6 months? No.  Has the patient been a risk to others within the distant past? No.   Prior Inpatient Therapy:   Prior Outpatient Therapy:    Alcohol Screening: 1. How often do you have a drink containing alcohol?: Never 2. How many drinks containing alcohol do you have on a typical day when you are drinking?: 1 or 2 3. How often do you have six or more drinks on one occasion?: Never AUDIT-C Score: 0 4. How often during the last year have you found that you were not able to stop drinking once you had started?: Never 5. How often during the last year have you  failed to do what was normally expected from you becasue of drinking?: Never 6. How often during the last year have you needed a first drink in the morning to get yourself going after a heavy drinking session?: Never 7. How often during the last year have you had a feeling of guilt of remorse after drinking?: Never 8. How often  during the last year have you been unable to remember what happened the night before because you had been drinking?: Never 9. Have you or someone else been injured as a result of your drinking?: No 10. Has a relative or friend or a doctor or another health worker been concerned about your drinking or suggested you cut down?: No Alcohol Use Disorder Identification Test Final Score (AUDIT): 0 Intervention/Follow-up: AUDIT Score <7 follow-up not indicated Substance Abuse History in the last 12 months:  No. Consequences of Substance Abuse: Negative Previous Psychotropic Medications: Yes  Psychological Evaluations: Yes  Past Medical History:  Past Medical History:  Diagnosis Date  . Depression    History reviewed. No pertinent surgical history. Family History: History reviewed. No pertinent family history. Family Psychiatric  History: He denied any family history of any psychiatric illness Tobacco Screening: Have you used any form of tobacco in the last 30 days? (Cigarettes, Smokeless Tobacco, Cigars, and/or Pipes): Yes Tobacco use, Select all that apply: 5 or more cigarettes per day Are you interested in Tobacco Cessation Medications?: No, patient refused Counseled patient on smoking cessation including recognizing danger situations, developing coping skills and basic information about quitting provided: Refused/Declined practical counseling Social History:  Social History   Substance and Sexual Activity  Alcohol Use Not on file     Social History   Substance and Sexual Activity  Drug Use Not on file    Additional Social History:      Pain Medications: See MAR Prescriptions: See MAR History of alcohol / drug use?: No history of alcohol / drug abuse                    Allergies:  No Known Allergies Lab Results:  Results for orders placed or performed during the hospital encounter of 05/07/18 (from the past 48 hour(s))  Lipid panel     Status: Abnormal   Collection Time:  05/08/18  6:31 AM  Result Value Ref Range   Cholesterol 208 (H) 0 - 200 mg/dL   Triglycerides 324 <401 mg/dL   HDL 30 (L) >02 mg/dL   Total CHOL/HDL Ratio 6.9 RATIO   VLDL 23 0 - 40 mg/dL   LDL Cholesterol 725 (H) 0 - 99 mg/dL    Comment:        Total Cholesterol/HDL:CHD Risk Coronary Heart Disease Risk Table                     Men   Women  1/2 Average Risk   3.4   3.3  Average Risk       5.0   4.4  2 X Average Risk   9.6   7.1  3 X Average Risk  23.4   11.0        Use the calculated Patient Ratio above and the CHD Risk Table to determine the patient's CHD Risk.        ATP III CLASSIFICATION (LDL):  <100     mg/dL   Optimal  366-440  mg/dL   Near or Above  Optimal  130-159  mg/dL   Borderline  [id number]  mg/dL   High  >045>190     mg/dL   Very High Performed at [facility], [address] [place], [place] [address]   TSH     Status: None   Collection Time: 05/08/18  6:31 AM  Result Value Ref Range   TSH 2.333 0.350 - 4.500 uIU/mL    Comment: Performed by a 3rd Generation assay with a functional sensitivity of <=0.01 uIU/mL. Performed at [facility], [address] [place], [place] [address]     Blood Alcohol level:  Lab Results  Component Value Date   ETH <10 05/06/2018    Metabolic Disorder Labs:  No results found for: HGBA1C, MPG No results found for: PROLACTIN Lab Results  Component Value Date   CHOL 208 (H) 05/08/2018   TRIG 117 05/08/2018   HDL 30 (L) 05/08/2018   CHOLHDL 6.9 05/08/2018   VLDL 23 05/08/2018   LDLCALC 155 (H) 05/08/2018    Current Medications: Current Facility-Administered Medications  Medication Dose Route Frequency Provider Last Rate Last Dose  . acetaminophen (TYLENOL) tablet 650 mg  650 mg Oral Q6H PRN Truman HaywardStarkes, Takia S, FNP      . alum & mag hydroxide-simeth (MAALOX/MYLANTA) 200-200-20 MG/5ML suspension 30 mL  30 mL Oral Q4H PRN Truman HaywardStarkes, Takia S, FNP      . ARIPiprazole  (ABILIFY) tablet 2 mg  2 mg Oral Daily Antonieta Pertlary, Greg Lawson, MD   2 mg at 05/08/18 1030  . buPROPion (WELLBUTRIN XL) 24 hr tablet 150 mg  150 mg Oral Daily Antonieta Pertlary, Greg Lawson, MD   150 mg at 05/08/18 1030  . hydrOXYzine (ATARAX/VISTARIL) tablet 25 mg  25 mg Oral Q6H PRN Donell SievertSimon, Spencer E, PA-C      . magnesium hydroxide (MILK OF MAGNESIA) suspension 30 mL  30 mL Oral Daily PRN Starkes, Takia S, FNP      . nicotine (NICODERM CQ - dosed in mg/24 hours) patch 21 mg  21 mg Transdermal Daily Donell SievertSimon, Spencer E, PA-C   21 mg at 05/08/18 1031  . traZODone (DESYREL) tablet 50 mg  50 mg Oral QHS,MR X 1 Simon, Spencer E, PA-C       PTA Medications: No medications prior to admission.    Musculoskeletal: Strength & Muscle Tone: within normal limits Gait & Station: normal Patient leans: N/A  Psychiatric Specialty Exam: Physical Exam  Nursing note and vitals reviewed. Constitutional: He is oriented to person, place, and time. He appears well-developed and well-nourished.  HENT:  Head: Normocephalic and atraumatic.  Respiratory: Effort normal.  Musculoskeletal: Normal range of motion.  Neurological: He is alert and oriented to person, place, and time.    ROS  Blood pressure 134/74, pulse (!) 111, temperature 98 F (36.7 C), temperature source Oral, resp. rate 20.There is no height or weight on file to calculate BMI.  General Appearance: Disheveled  Eye Contact:  Fair  Speech:  Normal Rate  Volume:  Decreased  Mood:  Anxious  Affect:  Congruent  Thought Process:  Coherent  Orientation:  Full (Time, Place, and Person)  Thought Content:  Logical  Suicidal Thoughts:  No  Homicidal Thoughts:  No  Memory:  Immediate;   Fair Recent;   Fair Remote;   Fair  Judgement:  Impaired  Insight:  Lacking  Psychomotor Activity:  Restlessness  Concentration:  Concentration: Fair and Attention Span: Fair  Recall:  [employer]  Fund of Knowledge:  Fair  Language:  Fair  Akathisia:  Negative  Handed:  Right  AIMS  (if indicated):     Assets:  Desire for Improvement Housing Leisure Time Physical Health Resilience Social Support  ADL's:  Intact  Cognition:  WNL  Sleep:  Number of Hours: 4.5    Treatment Plan Summary: Daily contact with patient to assess and evaluate symptoms and progress in treatment, Medication management and Plan Patient is seen and examined.  Patient is a 17 year old male with the above-stated past psychiatric history who was admitted for evaluation.  Given he seen his psychiatrist for an extensive period of time I am going to follow her recommendations at this time.  We will start Abilify 2 mg p.o. daily, as well as bupropion XL 150 mg p.o. daily.  We will also write for hydroxyzine as needed for anxiety.  He will be admitted to the unit.  He will be integrated into the milieu.  He will be placed on 15-minute checks.  He will be encouraged to attend groups and work on his coping skills.  He will be seen by social work both individually and in groups.  He will be monitored for side effects of medications.  We will contact his parents for collateral information.  Observation Level/Precautions:  15 minute checks  Laboratory:  Chemistry Profile  Psychotherapy:    Medications:    Consultations:    Discharge Concerns:    Estimated LOS:  Other:     Physician Treatment Plan for Primary Diagnosis: <principal problem not specified> Long Term Goal(s): Improvement in symptoms so as ready for discharge  Short Term Goals: Ability to identify changes in lifestyle to reduce recurrence of condition will improve, Ability to verbalize feelings will improve, Ability to disclose and discuss suicidal ideas, Ability to demonstrate self-control will improve, Ability to identify and develop effective coping behaviors will improve, Ability to maintain clinical measurements within normal limits will improve and Compliance with prescribed medications will improve  Physician Treatment Plan for Secondary  Diagnosis: Active Problems:   MDD (major depressive disorder), recurrent severe, without psychosis (HCC)  Long Term Goal(s): Improvement in symptoms so as ready for discharge  Short Term Goals: Ability to identify changes in lifestyle to reduce recurrence of condition will improve, Ability to verbalize feelings will improve, Ability to disclose and discuss suicidal ideas, Ability to demonstrate self-control will improve, Ability to identify and develop effective coping behaviors will improve, Ability to maintain clinical measurements within normal limits will improve and Compliance with prescribed medications will improve  I certify that inpatient services furnished can reasonably be expected to improve the patient's condition.    Antonieta Pert, MD 6/8/201912:01 PM

## 2018-05-09 MED ORDER — ARIPIPRAZOLE 2 MG PO TABS
2.0000 mg | ORAL_TABLET | Freq: Every day | ORAL | Status: DC
  Filled 2018-05-09: qty 1

## 2018-05-09 NOTE — Progress Notes (Signed)
[facility] MD Progress Note  05/09/2018 10:17 AM Lee Warren  MRN:  [id number] Subjective: Patient is seen and examined.  Patient is a 17 year old male with a past psychiatric history significant for depression, attention deficit disorder as well as anger issues who was admitted on 05/07/2018 with suicidal ideation.  He stated he is doing better today.  He stated his mood is improved.  He stated he seen a decrease in his irritability.  He did state that he was sedated during the day, and we discussed the possibility of moving the Abilify to bedtime.  He is in agreement with that.  He denied any suicidal ideation this a.m. Principal Problem: <principal problem not specified> Diagnosis:   Patient Active Problem List   Diagnosis Date Noted  . 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 Intermittent explosive disorder [F63.81]   . MDD (major depressive disorder), recurrent severe, without psychosis (HCC) [F33.2] 05/07/2018   Total Time spent with patient: 20 minutes  Past Psychiatric History: See admission H&P  Past Medical History:  Past Medical History:  Diagnosis Date  . Depression    History reviewed. No pertinent surgical history. Family History: History reviewed. No pertinent family history. Family Psychiatric  History: See admission H&P Social History:  Social History   Substance and Sexual Activity  Alcohol Use Not on file     Social History   Substance and Sexual Activity  Drug Use Not on file    Social History   Socioeconomic History  . Marital status: Single    Spouse name: Not on file  . Number of children: Not on file  . Years of education: Not on file  . Highest education level: Not on file  Occupational History  . Not on file  Social Needs  . Financial resource strain: Not on file  . Food insecurity:    Worry: Not on file    Inability: Not on file  . Transportation needs:    Medical: Not on file    Non-medical: Not on file  Tobacco Use  . Smoking status: Current  Every Day Smoker    Packs/day: 1.00    Years: 4.00    Pack years: 4.00    Types: Cigarettes  . Smokeless tobacco: Never Used  Substance and Sexual Activity  . Alcohol use: Not on file  . Drug use: Not on file  . Sexual activity: Not on file  Lifestyle  . Physical activity:    Days per week: Not on file    Minutes per session: Not on file  . Stress: Not on file  Relationships  . Social connections:    Talks on phone: Not on file    Gets together: Not on file    Attends religious service: Not on file    Active member of club or organization: Not on file    Attends meetings of clubs or organizations: Not on file    Relationship status: Not on file  Other Topics Concern  . Not on file  Social History Narrative  . Not on file   Additional Social History:    Pain Medications: See MAR Prescriptions: See MAR History of alcohol / drug use?: No history of alcohol / drug abuse                    Sleep: Good  Appetite:  Good  Current Medications: Current Facility-Administered Medications  Medication Dose Route Frequency Provider Last Rate Last Dose  . acetaminophen (TYLENOL) tablet 650  mg  650 mg Oral Q6H PRN Truman Hayward, FNP      . alum & mag hydroxide-simeth (MAALOX/MYLANTA) 200-200-20 MG/5ML suspension 30 mL  30 mL Oral Q4H PRN Truman Hayward, FNP      . [START ON 05/10/2018] ARIPiprazole (ABILIFY) tablet 2 mg  2 mg Oral QHS Antonieta Pert, MD      . buPROPion (WELLBUTRIN XL) 24 hr tablet 150 mg  150 mg Oral Daily Antonieta Pert, MD   150 mg at 05/09/18 0810  . hydrOXYzine (ATARAX/VISTARIL) tablet 25 mg  25 mg Oral Q6H PRN Kerry Hough, PA-C   25 mg at 05/08/18 2110  . magnesium hydroxide (MILK OF MAGNESIA) suspension 30 mL  30 mL Oral Daily PRN Starkes, Takia S, FNP      . nicotine (NICODERM CQ - dosed in mg/24 hours) patch 21 mg  21 mg Transdermal Daily Donell Sievert E, PA-C   21 mg at 05/09/18 0810  . traZODone (DESYREL) tablet 50 mg  50 mg Oral  QHS,MR X 1 Kerry Hough, PA-C   50 mg at 05/08/18 2110    Lab Results:  Results for orders placed or performed during the hospital encounter of 05/07/18 (from the past 48 hour(s))  Hemoglobin A1c     Status: None   Collection Time: 05/08/18  6:31 AM  Result Value Ref Range   Hgb A1c MFr Bld 4.9 4.8 - 5.6 %    Comment: (NOTE) Pre diabetes:          5.7%-6.4% Diabetes:              >6.4% Glycemic control for   <7.0% adults with diabetes    Mean Plasma Glucose 93.93 mg/dL    Comment: Performed at [facility], [address], [place], [place] [address]  Lipid panel     Status: Abnormal   Collection Time: 05/08/18  6:31 AM  Result Value Ref Range   Cholesterol 208 (H) 0 - 200 mg/dL   Triglycerides 604 <540 mg/dL   HDL 30 (L) >98 mg/dL   Total CHOL/HDL Ratio 6.9 RATIO   VLDL 23 0 - 40 mg/dL   LDL Cholesterol 119 (H) 0 - 99 mg/dL    Comment:        Total Cholesterol/HDL:CHD Risk Coronary Heart Disease Risk Table                     Men   Women  1/2 Average Risk   3.4   3.3  Average Risk       5.0   4.4  2 X Average Risk   9.6   7.1  3 X Average Risk  23.4   11.0        Use the calculated Patient Ratio above and the CHD Risk Table to determine the patient's CHD Risk.        ATP III CLASSIFICATION (LDL):  <100     mg/dL   Optimal  147-829  mg/dL   Near or Above                    Optimal  130-159  mg/dL   Borderline  562-130  mg/dL   High  >865     mg/dL   Very High Performed at [facility], [address] [place], [place] [address]   TSH     Status: None   Collection Time: 05/08/18  6:31 AM  Result Value Ref Range   TSH 2.333 0.350 - 4.500 uIU/mL    Comment: Performed by a 3rd Generation assay with a functional sensitivity of <=0.01 uIU/mL. Performed at [facility], [address] [place], [place] [address]     Blood Alcohol level:  Lab Results  Component Value Date   ETH <10 05/06/2018    Metabolic Disorder  Labs: Lab Results  Component Value Date   HGBA1C 4.9 05/08/2018   MPG 93.93 05/08/2018   No results found for: PROLACTIN Lab Results  Component Value Date   CHOL 208 (H) 05/08/2018   TRIG 117 05/08/2018   HDL 30 (L) 05/08/2018   CHOLHDL 6.9 05/08/2018   VLDL 23 05/08/2018   LDLCALC 155 (H) 05/08/2018    Physical Findings: AIMS: Facial and Oral Movements Muscles of Facial Expression: None, normal Lips and Perioral Area: None, normal Jaw: None, normal Tongue: None, normal,Extremity Movements Upper (arms, wrists, hands, fingers): None, normal Lower (legs, knees, ankles, toes): None, normal, Trunk Movements Neck, shoulders, hips: None, normal, Overall Severity Severity of abnormal movements (highest score from questions above): None, normal Incapacitation due to abnormal movements: None, normal Patient's awareness of abnormal movements (rate only patient's report): No Awareness, Dental Status Current problems with teeth and/or dentures?: No Does patient usually wear dentures?: No  CIWA:    COWS:     Musculoskeletal: Strength & Muscle Tone: within normal limits Gait & Station: normal Patient leans: N/A  Psychiatric Specialty Exam: Physical Exam  Nursing note and vitals reviewed. Constitutional: He is oriented to person, place, and time. He appears well-developed and well-nourished.  HENT:  Head: Atraumatic.  Respiratory: Effort normal.  Neurological: He is alert and oriented to person, place, and time.    ROS  Blood pressure (!) 102/55, pulse (!) 110, temperature 97.7 F (36.5 C), temperature source Oral, resp. rate 16, height 6\' 3"  (1.905 m), weight (!) 146.5 kg (323 lb).Body mass index is 40.37 kg/m.  General Appearance: Casual  Eye Contact:  Fair  Speech:  Garbled  Volume:  Normal  Mood:  Sedated  Affect:  Appropriate  Thought Process:  Coherent  Orientation:  Full (Time, Place, and Person)  Thought Content:  Logical  Suicidal Thoughts:  No  Homicidal  Thoughts:  No  Memory:  Immediate;   Fair Recent;   Fair Remote;   Fair  Judgement:  Intact  Insight:  Fair  Psychomotor Activity:  Decreased  Concentration:  Concentration: Fair and Attention Span: Fair  Recall:  [employer]Fair  Fund of Knowledge:  Fair  Language:  Fair  Akathisia:  Negative  Handed:  Right  AIMS (if indicated):     Assets:  Desire for Improvement Housing Resilience Social Support  ADL's:  Intact  Cognition:  WNL  Sleep:  Number of Hours: 6.5     Treatment Plan Summary: Daily contact with patient to assess and evaluate symptoms and progress in treatment, Medication management and Plan Patient is seen and examined.  Patient is a 17 year old male with the above-stated past psychiatric history seen in follow-up.  He is doing better today.  We will continue the Wellbutrin XL at 150 mg p.o. daily.  Additionally I will move the Abilify to 2 mg p.o. nightly.  No other changes in his medications.  Antonieta PertGreg Lawson Cosandra Plouffe, MD 05/09/2018, 10:17 AM

## 2018-05-09 NOTE — [facility] Group Notes (Signed)
05/09/2018 10Am Type of Therapy and Topic:  Group Therapy:  Identity and Relationships Participation Level:  Active  Description of Group: Using the 'Ungame' patients were guided to express themselves about a variety of topics. Selected cards for this game included identity and relationships. Patients were able to discuss dealing with positive and negative situations, identifying supports, and other ways to understand your identity. Patients shared unique viewpoints but often had similar characteristics.  Patients encouraged to use this dialogue to develop goals and supports for future progress. Therapeutic Goals: 1. Patient will discuss 2 positive and 2 negative situations in their life prior to admission 2. Patient will identify 2 positive support persons in their home environment 3. Patient will explore setting goals for themselves and identify supports they need to achieve these goals 4. Patient will demonstrate empathy for others in the group by responding with positive affirmations of group members shares. Summary of Patient Progress: Actively and appropriately engaged in the group. Patient was able to provide support and validation to other group members.Patient practiced active listening when interacting with the facilitator and other group members. Lee PandaJared spoke about when his friend passed away and the tolls that it took on him. He says "I was in a dark place when he died because we were close. I spoke to my girlfriend a lot about how I was feeling and she was able to get out of it." Patient is still in the process of obtaining treatment goals.      Therapeutic Modalities: Motivational Interviewing Cognitive Behavioral Therapy  Lee ShearsCassandra  Fanta Wimberley, LCSW 05/09/2018 11:44 AM

## 2018-05-09 NOTE — Progress Notes (Signed)
Pt presents with a flat affect and depressed mood. Pt rated on his self inventory sheet: depression 0/10, anxiety 3/10, hopelessness 0/10, good sleep and a good appetite. Pt denies SI/HI. Pt expressed to Clinical research associatewriter that he was having increased depression and anxiety because his girlfriend is currently in rehab. Pt expressed that he's been able to talk to her at least 15 minutes a day. Pt reports tolerating his meds well and denies any side effects. No concerns verbalized by pt.   Orders reviewed with pt. Verbal support provided. Pt encouraged to attend groups. 15 minute checks performed for safety.   Pt compliant with tx team.

## 2018-05-09 NOTE — Progress Notes (Signed)
Adult Psychoeducational Group Note  Date:  05/09/2018 Time:  10:18 AM  Group Topic/Focus:  Goals Group:   The focus of this group is to help patients establish daily goals to achieve during treatment and discuss how the patient can incorporate goal setting into their daily lives to aide in recovery.  Participation Level:  Active  Participation Quality:  Appropriate  Affect:  Appropriate  Cognitive:  Alert  Insight: Good  Engagement in Group:  Engaged  Modes of Intervention:  Rapport Building  Additional Comments:  Pt was active in group and talked about why he was here.  Graceann CongressWollie, Jossue Rubenstein Celcia 05/09/2018, 10:18 AM

## 2018-05-09 NOTE — Progress Notes (Signed)
D    Pt denies suicidal ideation   He reports he feels better and the doctor said he could be discharged on Monday   His behavior is appropriate and he is compliant with treatment  A     Verbal support given   Medications administered and effectiveness monitored    Q 15 min checks R    Pt remains safe

## 2018-05-09 NOTE — Progress Notes (Signed)
Pt attended group this evening. 

## 2018-05-09 NOTE — Progress Notes (Signed)
Adult Psychoeducational Group Note  Date:  05/09/2018 Time:  1315 Group Topic/Focus:  Nurse Psychoeducational group:   The purpose of this group is to help patients identify strategies for coping with mental health crisis.  Group discusses possible causes of crisis and ways to manage them effectively.  Participation Level:  Active  Participation Quality:  Appropriate  Affect:  Appropriate  Cognitive:  Appropriate  Insight: Appropriate  Engagement in Group:  Engaged  Modes of Intervention:  Discussion and Education  Additional Comments:

## 2018-05-10 MED ORDER — BUPROPION HCL ER (XL) 150 MG PO TB24: 150.0000 mg | ORAL_TABLET | Freq: Every day | ORAL | 0 refills | Status: AC

## 2018-05-10 MED ORDER — HYDROXYZINE HCL 25 MG PO TABS: 25.0000 mg | ORAL_TABLET | Freq: Four times a day (QID) | ORAL | 0 refills | Status: AC | PRN

## 2018-05-10 MED ORDER — ARIPIPRAZOLE 5 MG PO TABS
5.0000 mg | ORAL_TABLET | Freq: Every day | ORAL | Status: DC
  Administered 2018-05-10: 5 mg via ORAL
  Filled 2018-05-10 (×3): qty 1

## 2018-05-10 MED ORDER — TRAZODONE HCL 50 MG PO TABS: 50.0000 mg | ORAL_TABLET | Freq: Every evening | ORAL | 0 refills | Status: AC | PRN

## 2018-05-10 MED ORDER — ARIPIPRAZOLE 5 MG PO TABS: 5.0000 mg | ORAL_TABLET | Freq: Every day | ORAL | 0 refills | Status: AC

## 2018-05-10 NOTE — Progress Notes (Signed)
D   Pt is pleasant and appropriate   He reports he will be discharged tomorrow and he said he is ready   Pt expressing hopefullness and reports improved mood A   Verbal support given   Medication administered and effectiveness monitored    Q 15 min checks R   Pt is safe at present

## 2018-05-10 NOTE — [facility] Group Notes (Signed)
[facility] Group Notes:  (Nursing/MHT/Case Management/Adjunct)  Date:  05/10/2018  Time:  4:47 PM  Type of Therapy:  Psychoeducational Skills  Participation Level:  Active  Participation Quality:  Resistant  Affect:  Appropriate  Cognitive:  Alert and Oriented  Insight:  Lacking  Engagement in Group:  Engaged and Resistant  Modes of Intervention:  Activity, Discussion and Education  Summary of Progress/Problems: Pt was an active participant in group discussion. Was unable to identify a self care skill to work on post discharge due to negative thought patterns.   Brett AlbinoLandis, [place] Bryngelson E 05/10/2018, 4:47 PM

## 2018-05-10 NOTE — Progress Notes (Signed)
Patient attended group but did not have anything to share.  

## 2018-05-10 NOTE — Tx Team (Signed)
Interdisciplinary Treatment and Diagnostic Plan Update  05/10/2018 Time of Session: 9:40am Leonides GrillsJared E Brazel MRN: [id number]  Principal Diagnosis: <principal problem not specified>  Secondary Diagnoses: Active Problems:   MDD (major depressive disorder), recurrent severe, without psychosis (HCC)   Impulse control disorder   Intermittent explosive disorder   Current Medications:  Current Facility-Administered Medications  Medication Dose Route Frequency Provider Last Rate Last Dose  . acetaminophen (TYLENOL) tablet 650 mg  650 mg Oral Q6H PRN Truman HaywardStarkes, Takia S, FNP      . alum & mag hydroxide-simeth (MAALOX/MYLANTA) 200-200-20 MG/5ML suspension 30 mL  30 mL Oral Q4H PRN Truman HaywardStarkes, Takia S, FNP      . ARIPiprazole (ABILIFY) tablet 2 mg  2 mg Oral QHS Antonieta Pertlary, Greg Lawson, MD      . buPROPion (WELLBUTRIN XL) 24 hr tablet 150 mg  150 mg Oral Daily Antonieta Pertlary, Greg Lawson, MD   150 mg at 05/10/18 0836  . hydrOXYzine (ATARAX/VISTARIL) tablet 25 mg  25 mg Oral Q6H PRN Kerry HoughSimon, Spencer E, PA-C   25 mg at 05/08/18 2110  . magnesium hydroxide (MILK OF MAGNESIA) suspension 30 mL  30 mL Oral Daily PRN Starkes, Takia S, FNP      . nicotine (NICODERM CQ - dosed in mg/24 hours) patch 21 mg  21 mg Transdermal Daily Donell SievertSimon, Spencer E, PA-C   21 mg at 05/10/18 0836  . traZODone (DESYREL) tablet 50 mg  50 mg Oral QHS,MR X 1 Kerry HoughSimon, Spencer E, PA-C   50 mg at 05/08/18 2110   PTA Medications: No medications prior to admission.    Patient Stressors: Marital or family conflict Medication change or noncompliance  Patient Strengths: Average or above average intelligence General fund of knowledge Supportive family/friends  Treatment Modalities: Medication Management, Group therapy, Case management,  1 to 1 session with clinician, Psychoeducation, Recreational therapy.   Physician Treatment Plan for Primary Diagnosis: <principal problem not specified> Long Term Goal(s): Improvement in symptoms so as ready for  discharge Improvement in symptoms so as ready for discharge   Short Term Goals: Ability to identify changes in lifestyle to reduce recurrence of condition will improve Ability to verbalize feelings will improve Ability to disclose and discuss suicidal ideas Ability to demonstrate self-control will improve Ability to identify and develop effective coping behaviors will improve Ability to maintain clinical measurements within normal limits will improve Compliance with prescribed medications will improve Ability to identify changes in lifestyle to reduce recurrence of condition will improve Ability to verbalize feelings will improve Ability to disclose and discuss suicidal ideas Ability to demonstrate self-control will improve Ability to identify and develop effective coping behaviors will improve Ability to maintain clinical measurements within normal limits will improve Compliance with prescribed medications will improve  Medication Management: Evaluate patient's response, side effects, and tolerance of medication regimen.  Therapeutic Interventions: 1 to 1 sessions, Unit Group sessions and Medication administration.  Evaluation of Outcomes: Adequate for Discharge  Physician Treatment Plan for Secondary Diagnosis: Active Problems:   MDD (major depressive disorder), recurrent severe, without psychosis (HCC)   Impulse control disorder   Intermittent explosive disorder  Long Term Goal(s): Improvement in symptoms so as ready for discharge Improvement in symptoms so as ready for discharge   Short Term Goals: Ability to identify changes in lifestyle to reduce recurrence of condition will improve Ability to verbalize feelings will improve Ability to disclose and discuss suicidal ideas Ability to demonstrate self-control will improve Ability to identify and develop effective coping behaviors will  improve Ability to maintain clinical measurements within normal limits will  improve Compliance with prescribed medications will improve Ability to identify changes in lifestyle to reduce recurrence of condition will improve Ability to verbalize feelings will improve Ability to disclose and discuss suicidal ideas Ability to demonstrate self-control will improve Ability to identify and develop effective coping behaviors will improve Ability to maintain clinical measurements within normal limits will improve Compliance with prescribed medications will improve     Medication Management: Evaluate patient's response, side effects, and tolerance of medication regimen.  Therapeutic Interventions: 1 to 1 sessions, Unit Group sessions and Medication administration.  Evaluation of Outcomes: Adequate for Discharge   RN Treatment Plan for Primary Diagnosis: <principal problem not specified> Long Term Goal(s): Knowledge of disease and therapeutic regimen to maintain health will improve  Short Term Goals: Ability to demonstrate self-control, Ability to participate in decision making will improve, Ability to disclose and discuss suicidal ideas, Ability to identify and develop effective coping behaviors will improve and Compliance with prescribed medications will improve  Medication Management: RN will administer medications as ordered by provider, will assess and evaluate patient's response and provide education to patient for prescribed medication. RN will report any adverse and/or side effects to prescribing provider.  Therapeutic Interventions: 1 on 1 counseling sessions, Psychoeducation, Medication administration, Evaluate responses to treatment, Monitor vital signs and CBGs as ordered, Perform/monitor CIWA, COWS, AIMS and Fall Risk screenings as ordered, Perform wound care treatments as ordered.  Evaluation of Outcomes: Adequate for Discharge   LCSW Treatment Plan for Primary Diagnosis: <principal problem not specified> Long Term Goal(s): Safe transition to appropriate  next level of care at discharge, Engage patient in therapeutic group addressing interpersonal concerns.  Short Term Goals: Engage patient in aftercare planning with referrals and resources, Increase social support, Increase ability to appropriately verbalize feelings, Increase emotional regulation, Facilitate acceptance of mental health diagnosis and concerns, Facilitate patient progression through stages of change regarding substance use diagnoses and concerns, Identify triggers associated with mental health/substance abuse issues and Increase skills for wellness and recovery  Therapeutic Interventions: Assess for all discharge needs, 1 to 1 time with Social worker, Explore available resources and support systems, Assess for adequacy in community support network, Educate family and significant other(s) on suicide prevention, Complete Psychosocial Assessment, Interpersonal group therapy.  Evaluation of Outcomes: Adequate for Discharge   Progress in Treatment: Attending groups: Yes. Participating in groups: Yes. Taking medication as prescribed: Yes. Toleration medication: Yes. Family/Significant other contact made: No, will contact:  the patient's mother Patient understands diagnosis: Yes. Discussing patient identified problems/goals with staff: Yes. Medical problems stabilized or resolved: Yes. Denies suicidal/homicidal ideation: Yes. Issues/concerns per patient self-inventory: No. Other:  New problem(s) identified: None   New Short Term/Long Term Goal(s):Detox, medication stabilization, elimination of SI thoughts, development of comprehensive mental wellness plan.    Patient Goals:  "I just wanted to detox from my relationship issues and I had time to relieve my stress"   Discharge Plan or Barriers: Patient plans to return home and follow up with current outpatient provider, Dallie Piles, MD for medication management and therapy services.   Reason for Continuation of  Hospitalization:None   Estimated Length of Stay: Tuesday, 04/10/18  Attendees: Patient: Talor Cheema  05/10/2018 8:41 AM  Physician: Dr. Landry Mellow, MD  05/10/2018 8:41 AM  Nursing: Elby Beck, RN 05/10/2018 8:41 AM  RN Care Manager: Juliann Pares 05/10/2018 8:41 AM  Social Worker: Baldo Daub, LCSWA 05/10/2018 8:41 AM  Recreational Therapist: Juliann Pares 05/10/2018  8:41 AM  Other: X 05/10/2018 8:41 AM  Other: X 05/10/2018 8:41 AM  Other:X 05/10/2018 8:41 AM    Scribe for Treatment Team: Maeola Sarah, LCSWA 05/10/2018 8:41 AM

## 2018-05-10 NOTE — [facility] Suicide Risk Assessment (Signed)
[facility] INPATIENT:  Family/Significant Other Suicide Prevention Education  Suicide Prevention Education:  Education Completed; Lee Warren, mother [phone]) has been identified by the patient as the family member/significant other with whom the patient will be residing, and identified as the person(s) who will aid the patient in the event of a mental health crisis (suicidal ideations/suicide attempt).  With written consent from the patient, the family member/significant other has been provided the following suicide prevention education, prior to the and/or following the discharge of the patient.  The suicide prevention education provided includes the following:  Suicide risk factors  Suicide prevention and interventions  National Suicide Hotline telephone number  [facility] telephone number  [facility] 911  [facility] and/or [facility] telephone number  Request made of family/significant other to:  Remove weapons (e.g., guns, rifles, knives), all items previously/currently identified as safety concern.    Remove drugs/medications (over-the-counter, prescriptions, illicit drugs), all items previously/currently identified as a safety concern.  The family member/significant other verbalizes understanding of the suicide prevention education information provided.  The family member/significant other agrees to remove the items of safety concern listed above.   Patient's mother has no questions or concerns regarding the patient's scheduled discharge for 05/11/18 at 1:00pm.   Maeola SarahJolan E Lenord Fralix 05/10/2018, 3:17 PM

## 2018-05-10 NOTE — Progress Notes (Signed)
Recreation Therapy Notes  Date:  6.10.19  Time: 0930 Location: [address] Dayroom  Group Topic: Stress Management  Goal Area(s) Addresses:  Patient will verbalize importance of using healthy stress management.  Patient will identify positive emotions associated with healthy stress management.   Intervention: Stress Management  Activity :  Guided Imagery.  LRT introduced patients to the stress management technique of guided imagery.  LRT read a script that allowed patients to take a mental vacation to the beach.  Patients were to follow along as script was read to engage in the activity.  Education:  Stress Management, Discharge Planning.   Education Outcome: Acknowledges edcuation/In group clarification offered/Needs additional education  Clinical Observations/Feedback: Pt did not attend group.     Lee Warren, LRT/CTRS         Brand Siever A 05/10/2018 11:16 AM 

## 2018-05-10 NOTE — Progress Notes (Signed)
[facility] MD Progress Note  05/10/2018 12:56 PM Lee Warren  MRN:  [id number] Subjective: Patient is seen and examined.  Patient is a 17 year old male with a past psychiatric history significant for depression, attention deficit disorder as well as anger issues who was admitted on 05/07/2018 with suicidal ideation.  He continues to slowly improve.  He stated 1 of the biggest stressors that he had prior to admission was the fact that his girlfriend had "bigger mental health issues than me".  Apparently she has gotten involved now in a rehabilitation facility.  He stated that prior to her admitting to this and going for help that she had resisted help from himself, his parents, and other people.  He stated the fact that she is in rehab now takes a great deal of stress off of him.  He is able to smile and engage.  He denied any side effects to his current medications.  We did discuss the possibility of elevating his Abilify dosage just slightly.  He denied any current suicidal ideation. Principal Problem: <principal problem not specified> Diagnosis:   Patient Active Problem List   Diagnosis Date Noted  . 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 Intermittent explosive disorder [F63.81]   . MDD (major depressive disorder), recurrent severe, without psychosis (HCC) [F33.2] 05/07/2018   Total Time spent with patient: 20 minutes  Past Psychiatric History: See admission H&P  Past Medical History:  Past Medical History:  Diagnosis Date  . Depression    History reviewed. No pertinent surgical history. Family History: History reviewed. No pertinent family history. Family Psychiatric  History: See admission H&P Social History:  Social History   Substance and Sexual Activity  Alcohol Use Not on file     Social History   Substance and Sexual Activity  Drug Use Not on file    Social History   Socioeconomic History  . Marital status: Single    Spouse name: Not on file  . Number of children: Not on file   . Years of education: Not on file  . Highest education level: Not on file  Occupational History  . Not on file  Social Needs  . Financial resource strain: Not on file  . Food insecurity:    Worry: Not on file    Inability: Not on file  . Transportation needs:    Medical: Not on file    Non-medical: Not on file  Tobacco Use  . Smoking status: Current Every Day Smoker    Packs/day: 1.00    Years: 4.00    Pack years: 4.00    Types: Cigarettes  . Smokeless tobacco: Never Used  Substance and Sexual Activity  . Alcohol use: Not on file  . Drug use: Not on file  . Sexual activity: Not on file  Lifestyle  . Physical activity:    Days per week: Not on file    Minutes per session: Not on file  . Stress: Not on file  Relationships  . Social connections:    Talks on phone: Not on file    Gets together: Not on file    Attends religious service: Not on file    Active member of club or organization: Not on file    Attends meetings of clubs or organizations: Not on file    Relationship status: Not on file  Other Topics Concern  . Not on file  Social History Narrative  . Not on file   Additional Social History:    Pain  Medications: See MAR Prescriptions: See MAR History of alcohol / drug use?: No history of alcohol / drug abuse                    Sleep: Good  Appetite:  Good  Current Medications: Current Facility-Administered Medications  Medication Dose Route Frequency Provider Last Rate Last Dose  . acetaminophen (TYLENOL) tablet 650 mg  650 mg Oral Q6H PRN Truman Hayward, FNP      . alum & mag hydroxide-simeth (MAALOX/MYLANTA) 200-200-20 MG/5ML suspension 30 mL  30 mL Oral Q4H PRN Truman Hayward, FNP      . ARIPiprazole (ABILIFY) tablet 5 mg  5 mg Oral QHS Antonieta Pert, MD      . buPROPion (WELLBUTRIN XL) 24 hr tablet 150 mg  150 mg Oral Daily Antonieta Pert, MD   150 mg at 05/10/18 0836  . hydrOXYzine (ATARAX/VISTARIL) tablet 25 mg  25 mg Oral Q6H  PRN Kerry Hough, PA-C   25 mg at 05/08/18 2110  . magnesium hydroxide (MILK OF MAGNESIA) suspension 30 mL  30 mL Oral Daily PRN Starkes, Takia S, FNP      . nicotine (NICODERM CQ - dosed in mg/24 hours) patch 21 mg  21 mg Transdermal Daily Donell Sievert E, PA-C   21 mg at 05/10/18 0836  . traZODone (DESYREL) tablet 50 mg  50 mg Oral QHS,MR X 1 Kerry Hough, PA-C   50 mg at 05/08/18 2110    Lab Results: No results found for this or any previous visit (from the past 48 hour(s)).  Blood Alcohol level:  Lab Results  Component Value Date   ETH <10 05/06/2018    Metabolic Disorder Labs: Lab Results  Component Value Date   HGBA1C 4.9 05/08/2018   MPG 93.93 05/08/2018   No results found for: PROLACTIN Lab Results  Component Value Date   CHOL 208 (H) 05/08/2018   TRIG 117 05/08/2018   HDL 30 (L) 05/08/2018   CHOLHDL 6.9 05/08/2018   VLDL 23 05/08/2018   LDLCALC 155 (H) 05/08/2018    Physical Findings: AIMS: Facial and Oral Movements Muscles of Facial Expression: None, normal Lips and Perioral Area: None, normal Jaw: None, normal Tongue: None, normal,Extremity Movements Upper (arms, wrists, hands, fingers): None, normal Lower (legs, knees, ankles, toes): None, normal, Trunk Movements Neck, shoulders, hips: None, normal, Overall Severity Severity of abnormal movements (highest score from questions above): None, normal Incapacitation due to abnormal movements: None, normal Patient's awareness of abnormal movements (rate only patient's report): No Awareness, Dental Status Current problems with teeth and/or dentures?: No Does patient usually wear dentures?: No  CIWA:    COWS:     Musculoskeletal: Strength & Muscle Tone: within normal limits Gait & Station: normal Patient leans: N/A  Psychiatric Specialty Exam: Physical Exam  Nursing note and vitals reviewed. Constitutional: He is oriented to person, place, and time. He appears well-developed and well-nourished.   HENT:  Head: Normocephalic and atraumatic.  Respiratory: Effort normal.  Neurological: He is alert and oriented to person, place, and time.    ROS  Blood pressure 131/80, pulse (!) 105, temperature 97.8 F (36.6 C), temperature source Oral, resp. rate 18, height 6\' 3"  (1.905 m), weight (!) 146.5 kg (323 lb).Body mass index is 40.37 kg/m.  General Appearance: Casual  Eye Contact:  Fair  Speech:  Normal Rate  Volume:  Normal  Mood:  Anxious  Affect:  Congruent  Thought Process:  Coherent  Orientation:  Full (Time, Place, and Person)  Thought Content:  Logical  Suicidal Thoughts:  No  Homicidal Thoughts:  No  Memory:  Immediate;   Fair Recent;   Fair Remote;   Fair  Judgement:  Intact  Insight:  Fair  Psychomotor Activity:  Normal  Concentration:  Concentration: Fair and Attention Span: Fair  Recall:  FiservFair  Fund of Knowledge:  Fair  Language:  Good  Akathisia:  Negative  Handed:  Right  AIMS (if indicated):     Assets:  Desire for Improvement Financial Resources/Insurance Housing Leisure Time Physical Health Resilience Social Support  ADL's:  Intact  Cognition:  WNL  Sleep:  Number of Hours: 6.25     Treatment Plan Summary: Daily contact with patient to assess and evaluate symptoms and progress in treatment, Medication management and Plan Patient is seen and examined.  Patient is a 17 year old male with the above-stated past psychiatric history seen in follow-up.  He continues to slowly improve.  I am going to increase his Abilify to 5 mg p.o. nightly.  We will continue the Wellbutrin XL 150 mg p.o. daily.  If everything continues to go well I would anticipate discharge probably tomorrow.  Antonieta PertGreg Lawson Clary, MD 05/10/2018, 12:56 PM

## 2018-05-10 NOTE — [facility] Group Notes (Signed)
[facility] LCSW Group Therapy Note  Date/Time: 05/10/18, 1315  Type of Therapy and Topic:  Group Therapy:  Overcoming Obstacles  Participation Level:  active  Description of Group:    In this group patients will be encouraged to explore what they see as obstacles to their own wellness and recovery. They will be guided to discuss their thoughts, feelings, and behaviors related to these obstacles. The group will process together ways to cope with barriers, with attention given to specific choices patients can make. Each patient will be challenged to identify changes they are motivated to make in order to overcome their obstacles. This group will be process-oriented, with patients participating in exploration of their own experiences as well as giving and receiving support and challenge from other group members.  Therapeutic Goals: 1. Patient will identify personal and current obstacles as they relate to admission. 2. Patient will identify barriers that currently interfere with their wellness or overcoming obstacles.  3. Patient will identify feelings, thought process and behaviors related to these barriers. 4. Patient will identify two changes they are willing to make to overcome these obstacles:    Summary of Patient Progress: Pt identified grief/loss and employment as obstacles in his life.  Pt was active in group discussion regarding steps he could take to overcome obstacles in his life.      Therapeutic Modalities:   Cognitive Behavioral Therapy Solution Focused Therapy Motivational Interviewing Relapse Prevention Therapy  Daleen SquibbGreg Javeon Macmurray, LCSW

## 2018-05-11 ENCOUNTER — Encounter (HOSPITAL_COMMUNITY): Payer: Self-pay | Admitting: Behavioral Health

## 2018-05-11 DIAGNOSIS — G4700 Insomnia, unspecified: Secondary | ICD-10-CM

## 2018-05-11 NOTE — [facility] Suicide Risk Assessment (Addendum)
[facility]icide Risk Assessment   Principal Problem:  Depression Discharge Diagnoses:  Patient Active Problem List   Diagnosis Date Noted  . 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 Intermittent explosive disorder [F63.81]   . MDD (major depressive disorder), recurrent severe, without psychosis (HCC) [F33.2] 05/07/2018    Total Time spent with patient: 30 minutes  Musculoskeletal: Strength & Muscle Tone: within normal limits Gait & Station: normal Patient leans: N/A  Psychiatric Specialty Exam: ROS denies headache, no chest pain, no dyspnea, no nausea or vomiting, no fever  Blood pressure (!) 150/120, pulse (!) 109, temperature (!) 97.5 F (36.4 C), temperature source Oral, resp. rate 18, height 6\' 3"  (1.905 m), weight (!) 146.5 kg (323 lb).Body mass index is 40.37 kg/m.  Repeat BP 124/84  General Appearance: Fairly Groomed  Patent attorney::  Good  Speech:  Normal Rate409  Volume:  Normal  Mood:  reports mood is "  a lot better", states feeling " back to normal"  Affect:  reactive, slighlty anxious   Thought Process:  Linear and Descriptions of Associations: Intact  Orientation:  Full (Time, Place, and Person)  Thought Content:  denies hallucinations, no delusions, not internally preoccupied   Suicidal Thoughts:  No denies any suicidal or self injurious ideations, denies any homicidal or violent ideations  Homicidal Thoughts:  No  Memory:  recent and remote grossly intact   Judgement:  Other:  improving   Insight:  improving   Psychomotor Activity:  Normal  Concentration:  Good  Recall:  Good  Fund of Knowledge:Good  Language: Good  Akathisia:  Negative  Handed:  Right  AIMS (if indicated):     Assets:  Desire for Improvement Social Support  Sleep:  Number of Hours: 6.75  Cognition: WNL  ADL's:  Intact   Mental Status Per Nursing Assessment::   On Admission:  Self-harm thoughts  Demographic Factors:  17 year old, single, no children, lives with parents,  graduated HS, self employed   Loss Factors: Relationship stressors, states GF has psychiatric illness   Historical Factors: Denies history of prior psychiatric admissions, history of one suicide attempt at age 6, denies history of drug abuse, reports history of depression.  Risk Reduction Factors:   Sense of responsibility to family, Living with another person, especially a relative and Positive coping skills or problem solving skills  Continued Clinical Symptoms:  At this time patient is alert, attentive, reports he is feeling better and states " this is the best I have felt for the past couple of months", affect more reactive, no thought disorder, no suicidal or self injurious ideations, no homicidal or violent ideations, no hallucinations, no delusions, not internally preoccupied . Future oriented . Currently on Wellbutrin and on Abilify. States he had been on Wellbutrin in the past with good response and no side effects. Denies any history of seizures . Tolerating medications well , we reviewed medication side effects, including potential risk of increased suicidal ideations early in treatment with antidepressants in young adults No disruptive or agitated behaviors on unit , going to groups, pleasant on approach. Have reviewed chart notes, discussed case with treatment team.    Cognitive Features That Contribute To Risk:  No gross cognitive deficits noted upon discharge. Is alert , attentive, and oriented x 3   Suicide Risk:  Mild:  Suicidal ideation of limited frequency, intensity, duration, and specificity.  There are no identifiable plans, no associated intent, mild dysphoria and related symptoms, good self-control (both objective and  subjective assessment), few other risk factors, and identifiable protective factors, including available and accessible social support.  Follow-up Information    Dr. Abner GreenspanKim Dansie,MD. Go on 05/18/2018.   Why:  Appointment is Tuesday, 05/18/18. Please be  sure to take you discharge paperwork from the hospital.  Contact information: [address], [place], [place], [address]  Phone: [phone] Fax:[phone]          Plan Of Care/Follow-up recommendations:  Activity:  as tolerated  Diet:  regular Tests:  NA Other:  See below Patient is expressing readiness for discharge and leaving unit in good spirits  Plans to return home - states father will be picking him up later today Plans to follow up as above, also has an established therapist- Candice . Craige CottaFernando A Abou Sterkel, MD 05/11/2018, 12:08 PM

## 2018-05-11 NOTE — Progress Notes (Signed)
  [facility]charge [facility] :  Will you be returning to the same living situation after discharge:  Yes,  patient is returning home with his parents At discharge, do you have transportation home?: Yes,  patient's father will pick him up at discharge Do you have the ability to pay for your medications: Yes,  [place], support from parents  Release of information consent forms completed and in the chart;  Patient's signature needed at discharge.  Patient to Follow up at: Follow-up Information    Dr. Abner GreenspanKim Dansie,MD. Go on 05/18/2018.   Why:  Appointment is Tuesday, 05/18/18. Please be sure to take you discharge paperwork from the hospital.  Contact information: [address], [place], [place], [address]  Phone: [phone] Fax:[phone]          Next level of care provider has [facility]:yes  Safety Planning and Suicide Prevention discussed: Yes,  with the patient's motherr  Have you used any form of tobacco in the last 30 days? (Cigarettes, Smokeless Tobacco, Cigars, and/or Pipes): Yes  Has patient been referred to the Quitline?: Patient refused referral  Patient has been referred for addiction treatment: N/A  Lee SarahJolan E Zamere Warren, LCSWA 05/11/2018, 2:28 PM

## 2018-05-11 NOTE — Progress Notes (Signed)
Assuming care- [facility] @ [phone]. Pt resting in bed with eyes closed. Respirations even and unlabored. Pt continues to remain safe on the unit/ Observed by 15 min rounds. RN will continue to monitor.

## 2018-05-11 NOTE — Progress Notes (Signed)
Patient ID: Lee Warren, male   DOB: 12-26-2000, 17 y.o.   MRN: [id number]   Patient discharged per MD orders. Patient given education regarding follow-up appointments and medications. Patient denies any questions or concerns about these instructions. Patient was escorted to locker and given belongings before discharge to hospital lobby. Patient currently denies SI/HI and auditory and visual hallucinations on discharge.

## 2018-05-11 NOTE — Discharge Summary (Addendum)
Physician Discharge Summary Note  Patient:  Lee Warren is an 17 y.o., male MRN:  [id number] DOB:  2001-04-18 Patient phone:  [phone] (home)  Patient address:   [address] [place] [address],  Total Time spent with patient: 30 minutes  Date of Admission:  05/07/2018 Date of Discharge: 05/11/2018  Reason for Admission:  Worsening depression, SI and irritability.   Principal Problem: <principal problem not specified> Discharge Diagnoses: Patient Active Problem List   Diagnosis Date Noted  . 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 Intermittent explosive disorder [F63.81]   . MDD (major depressive disorder), recurrent severe, without psychosis (HCC) [F33.2] 05/07/2018    Past Psychiatric History: Patient is had one previous psychiatric admission at age 17 after attempting to hang himself.  He has been seen by psychiatrist as an outpatient for several years.  He admitted that he had previously been treated with Vyvanse and other stimulants.  He is also been previously treated with Wellbutrin    Past Medical History:  Past Medical History:  Diagnosis Date  . Depression    History reviewed. No pertinent surgical history. Family History: History reviewed. No pertinent family history. Family Psychiatric  History: He denied any family history of any psychiatric illness   Social History:  Social History   Substance and Sexual Activity  Alcohol Use Not on file     Social History   Substance and Sexual Activity  Drug Use Not on file    Social History   Socioeconomic History  . Marital status: Single    Spouse name: Not on file  . Number of children: Not on file  . Years of education: Not on file  . Highest education level: Not on file  Occupational History  . Not on file  Social Needs  . Financial resource strain: Not on file  . Food insecurity:    Worry: Not on file    Inability: Not on file  . Transportation needs:    Medical: Not on file     Non-medical: Not on file  Tobacco Use  . Smoking status: Current Every Day Smoker    Packs/day: 1.00    Years: 4.00    Pack years: 4.00    Types: Cigarettes  . Smokeless tobacco: Never Used  Substance and Sexual Activity  . Alcohol use: Not on file  . Drug use: Not on file  . Sexual activity: Not on file  Lifestyle  . Physical activity:    Days per week: Not on file    Minutes per session: Not on file  . Stress: Not on file  Relationships  . Social connections:    Talks on phone: Not on file    Gets together: Not on file    Attends religious service: Not on file    Active member of club or organization: Not on file    Attends meetings of clubs or organizations: Not on file    Relationship status: Not on file  Other Topics Concern  . Not on file  Social History Narrative  . Not on file    Hospital Course: Patient is seen and examined.  Patient is a 17 year old male with a reported past psychiatric history significant for depression, attention deficit disorder as well as anger issues which have recently escalated.  He presented to [facility] emergency department on 05/06/2018 for evaluation.  The patient admitted that recently his depression had worsened over the last several weeks, and he had seen  an increase in his irritability.  He stated that the irritability was secondary to problems in a relationship as well as financial problems.  He also admitted to having fleeting suicidal ideation.  He had been seeing a psychiatrist as an outpatient for several years.  He had last seen a psychiatrist in March of this year.  The psychiatrist recommended starting Wellbutrin as well as Abilify.  He stated that Wellbutrin had previously helped him.  He felt at that time that he was actually turning the corner and doing better, so he did not start these medications.  He did not follow-up with his psychiatrist.  He stated 1 of the biggest issues with the relationship is that  "she has worse mental health issues than I do".  He lives with his parents, and apparently his depression, irritability and suicidal thoughts were quite apparent to his parents.  They threatened involuntary commitment.  He decided to come in voluntarily.  He has a past history of suicide attempt by hanging at age 59.  He was admitted to the hospital for evaluation and stabilization.   After the above admission assessment, patients presenting symptoms were identified. His UDS was negative. Lipid panel showed cholesterol of 208, HDL 30 and LDL 155. HgbA1c and TSH normal. Ethanol in normal range.  He was medicated & discharged on; Wellbutrin XL 150 mg p.o. Daily for depression,  Abilify 5 mg p.o. daily for mood stabilization, Vistaril 25 mg Q 6 hours PRN for anxiety and Trazodone 50 mg po daily at bedtime as needed for insomnia.   He tolerated his treatment regimen without any adverse effects reported. During his hospital course, he was enrolled & actively  participated in the group counseling sessions. AA/NA meetings were offered & held on this unit and patient actively particpated. He was able to verbalize coping skills that should help him cope better to maintain depression/mood stability upon returning home.  During the course of his hospitalization, patients improvement was monitored by observation and his daily report of symptom reduction. Evidence was further noted by  presentation of good affect and improved mood & behavior. Upon discharge,he denied any SIHI, AVH, delusional thoughts or paranoia. He also denied any substance withdrawal symptoms. His case was presented during treatment team meeting this morning. The team members all agreed that Al was both mentally & medically stable to be discharged to continue mental health care on an outpatient basis as noted below. He was provided with all the necessary information needed to make this appointment without problems. He was provided with a   prescription for his [facility] discharge medications to be taken to his local pharmacy and resume once discharged. He left [facility] with all personal belongings in no apparent distress. Transportation per his arrangement.  Physical Findings: AIMS: Facial and Oral Movements Muscles of Facial Expression: None, normal Lips and Perioral Area: None, normal Jaw: None, normal Tongue: None, normal,Extremity Movements Upper (arms, wrists, hands, fingers): None, normal Lower (legs, knees, ankles, toes): None, normal, Trunk Movements Neck, shoulders, hips: None, normal, Overall Severity Severity of abnormal movements (highest score from questions above): None, normal Incapacitation due to abnormal movements: None, normal Patient's awareness of abnormal movements (rate only patient's report): No Awareness, Dental Status Current problems with teeth and/or dentures?: No Does patient usually wear dentures?: No  CIWA:    COWS:     Musculoskeletal: Strength & Muscle Tone: within normal limits Gait & Station: normal Patient leans: N/A  Psychiatric Specialty Exam: SEE SRA BY MD  Physical Exam  Nursing note and vitals reviewed. Constitutional: He is oriented to person, place, and time.  Neurological: He is alert and oriented to person, place, and time.    Review of Systems  Psychiatric/Behavioral: Negative for hallucinations, memory loss, substance abuse and suicidal ideas. Depression: improved. Nervous/anxious: improved. Insomnia: improved.   All other systems reviewed and are negative.   Blood pressure (!) 150/120, pulse (!) 109, temperature (!) 97.5 F (36.4 C), temperature source Oral, resp. rate 18, height 6\' 3"  (1.905 m), weight (!) 146.5 kg (323 lb).Body mass index is 40.37 kg/m.   Have you used any form of tobacco in the last 30 days? (Cigarettes, Smokeless Tobacco, Cigars, and/or Pipes): Yes  Has this patient used any form of tobacco in the last 30 days? (Cigarettes, Smokeless Tobacco, Cigars,  and/or Pipes) Yes, Yes, A prescription for an FDA-approved tobacco cessation medication was offered at discharge and the patient refused  Blood Alcohol level:  Lab Results  Component Value Date   ETH <10 05/06/2018    Metabolic Disorder Labs:  Lab Results  Component Value Date   HGBA1C 4.9 05/08/2018   MPG 93.93 05/08/2018   No results found for: PROLACTIN Lab Results  Component Value Date   CHOL 208 (H) 05/08/2018   TRIG 117 05/08/2018   HDL 30 (L) 05/08/2018   CHOLHDL 6.9 05/08/2018   VLDL 23 05/08/2018   LDLCALC 155 (H) 05/08/2018    See Psychiatric Specialty Exam and Suicide Risk Assessment completed by Attending Physician prior to discharge.  Discharge destination:  Home  Is patient on multiple antipsychotic therapies at discharge:  No   Has Patient had three or more failed trials of antipsychotic monotherapy by history:  No  Recommended Plan for Multiple Antipsychotic Therapies: NA  Discharge Instructions    Diet - low sodium heart healthy   Complete by:  As directed    Discharge instructions   Complete by:  As directed    Follow-up with outpatient provider   Increase activity slowly   Complete by:  As directed      Allergies as of 05/11/2018   No Known Allergies     Medication List    TAKE these medications     Indication  ARIPiprazole 5 MG tablet Commonly known as:  ABILIFY Take 1 tablet (5 mg total) by mouth at bedtime.  Indication:  Major Depressive Disorder   buPROPion 150 MG 24 hr tablet Commonly known as:  WELLBUTRIN XL Take 1 tablet (150 mg total) by mouth daily.  Indication:  Major Depressive Disorder   hydrOXYzine 25 MG tablet Commonly known as:  ATARAX/VISTARIL Take 1 tablet (25 mg total) by mouth every 6 (six) hours as needed for anxiety.  Indication:  Feeling Anxious   traZODone 50 MG tablet Commonly known as:  DESYREL Take 1 tablet (50 mg total) by mouth at bedtime and may repeat dose one time if needed.  Indication:  Trouble  Sleeping        Follow-up recommendations: Follow up with your outpatient provided for any medical issues. Activity & diet as recommended by your primary care provider.   Comments:  Patient is instructed prior to discharge to: Take all medications as prescribed by his/her mental healthcare provider. Report any adverse effects and or reactions from the medicines to his/her outpatient provider promptly. Patient has been instructed & cautioned: To not engage in alcohol and or illegal drug use while on prescription medicines. In the event of worsening symptoms, patient is instructed  to call the crisis hotline, 911 and or go to the nearest ED for appropriate evaluation and treatment of symptoms. To follow-up with his/her primary care provider for your other medical issues, concerns and or health care needs.  Signed: Denzil MagnusonLaShunda Thomas, NP 05/11/2018, 9:31 AM   Patient seen, Suicide Assessment Completed.  Disposition Plan Reviewed
